# Patient Record
Sex: Female | Born: 1980
Health system: Southern US, Community
[De-identification: ages and names within clinical notes are randomized; demographics above are authoritative.]

## PROBLEM LIST (undated history)

## (undated) DIAGNOSIS — R011 Cardiac murmur, unspecified: Secondary | ICD-10-CM

## (undated) DIAGNOSIS — I499 Cardiac arrhythmia, unspecified: Secondary | ICD-10-CM

## (undated) DIAGNOSIS — F419 Anxiety disorder, unspecified: Secondary | ICD-10-CM

## (undated) DIAGNOSIS — Z8619 Personal history of other infectious and parasitic diseases: Secondary | ICD-10-CM

## (undated) DIAGNOSIS — D649 Anemia, unspecified: Secondary | ICD-10-CM

## (undated) HISTORY — DX: Anemia, unspecified: D64.9

## (undated) HISTORY — DX: Personal history of other infectious and parasitic diseases: Z86.19

## (undated) HISTORY — DX: Cardiac murmur, unspecified: R01.1

## (undated) HISTORY — DX: Anxiety disorder, unspecified: F41.9

## (undated) HISTORY — PX: NO PAST SURGERIES: SHX2092

---

## 2008-09-09 DIAGNOSIS — F419 Anxiety disorder, unspecified: Secondary | ICD-10-CM

## 2008-09-09 HISTORY — DX: Anxiety disorder, unspecified: F41.9

## 2010-10-03 ENCOUNTER — Ambulatory Visit: Admit: 2010-10-03 | Payer: Self-pay | Admitting: Obstetrics and Gynecology

## 2011-03-27 ENCOUNTER — Encounter: Payer: Self-pay | Admitting: Obstetrics & Gynecology

## 2011-03-29 ENCOUNTER — Other Ambulatory Visit: Payer: Self-pay | Admitting: Advanced Practice Midwife

## 2011-03-29 ENCOUNTER — Other Ambulatory Visit (HOSPITAL_COMMUNITY)
Admission: RE | Admit: 2011-03-29 | Discharge: 2011-03-29 | Disposition: A | Discharge status: Home / Self Care | Payer: BC Managed Care – PPO | Source: Ambulatory Visit | Attending: Advanced Practice Midwife | Admitting: Advanced Practice Midwife

## 2011-03-29 ENCOUNTER — Ambulatory Visit (INDEPENDENT_AMBULATORY_CARE_PROVIDER_SITE_OTHER): Payer: BC Managed Care – PPO | Admitting: Physician Assistant

## 2011-03-29 ENCOUNTER — Encounter: Payer: Self-pay | Admitting: Physician Assistant

## 2011-03-29 VITALS — BP 123/81 | HR 92 | Temp 97.8°F | Ht 62.0 in | Wt 111.0 lb

## 2011-03-29 DIAGNOSIS — O039 Complete or unspecified spontaneous abortion without complication: Secondary | ICD-10-CM

## 2011-03-29 DIAGNOSIS — Z01419 Encounter for gynecological examination (general) (routine) without abnormal findings: Secondary | ICD-10-CM | POA: Insufficient documentation

## 2011-03-29 DIAGNOSIS — N941 Unspecified dyspareunia: Secondary | ICD-10-CM

## 2011-03-29 DIAGNOSIS — IMO0002 Reserved for concepts with insufficient information to code with codable children: Secondary | ICD-10-CM

## 2011-03-29 DIAGNOSIS — Z538 Procedure and treatment not carried out for other reasons: Secondary | ICD-10-CM

## 2011-03-29 LAB — POCT PREGNANCY, URINE: Preg Test, Ur: NEGATIVE

## 2011-03-29 LAB — POCT URINE PREGNANCY: Preg Test, Ur: NEGATIVE

## 2011-03-29 NOTE — Progress Notes (Signed)
Negative pregnancy test

## 2011-03-29 NOTE — Progress Notes (Deleted)
Subjective:     Patient ID: Michelle Hayes, female   DOB: Jun 15, 1981, 30 y.o.   MRN: [id number]  HPI   Review of Systems     Objective:   Physical Exam     Assessment:     ***    Plan:     ***

## 2011-03-29 NOTE — Progress Notes (Signed)
  Subjective:    Patient ID: Michelle Hayes, female    DOB: [birth year], 30 y.o.   MRN: [id number]  HPI Presents with c/o dyspareunia at times since the birth of her child several years ago. She states that it does not hurt every time nor in every position, but some positions hurt some times. She had none of this prior to pregnancy. She reports having a large hematoma after her childbirth and did not have intercourse "for a long time" but then started having intermittent sharp pains with some positions.   She also reports possibly having a miscarriage last week. She took a pregnancy test 1.5 weeks ago which was positive, but then had an episode of heavy bleeding for several days last week. The pregnancy test after that was negative.   She also requests a pap test today but declines STD testing.    Review of Systems  Constitutional: Negative for appetite change.  Genitourinary: Positive for vaginal bleeding (last week) and pelvic pain (with intercourse intermittently in certain positions). Negative for dysuria.       Objective:   Physical Exam  Constitutional: She is oriented to person, place, and time. She appears well-developed and well-nourished.  HENT:  Head: Normocephalic.  Neck: Normal range of motion.  Cardiovascular: Normal rate.   Pulmonary/Chest: Effort normal.  Abdominal: Soft. She exhibits no distension and no mass. There is no tenderness. There is no rebound and no guarding.  Genitourinary: Vagina normal and uterus normal. Guaiac stool: Cervix large, TZ zone seen, Pap done.       Uterus small, nontender, about 4-5 wks size. Ovaries nontender, no masses appreciated. No blood seen at all  Musculoskeletal: Normal range of motion.  Neurological: She is alert and oriented to person, place, and time.  Skin: Skin is warm and dry.  Psychiatric: She has a normal mood and affect.          Assessment & Plan:  A:  Normal well woman exam Dyspareunia, probably positional, no  abnormalities noted to account for pain Probably recent SAB, desires to conceive again  P:  Pap sent. Declines cultures Ultrasound ordered to r/o pathology Quant HCG and Type/Rh Will follow Quants until <2 Discussed conception after miscarriage. Will continue PNV.   Return for results of U/S and labs

## 2011-03-29 NOTE — Patient Instructions (Signed)
Miscarriage An early pregnancy loss or spontaneous abortion (miscarriage) is a common problem. This usually happens when the pregnancy is not developing normally. It is very unlikely that you or your partner did anything to cause this, although cigarette smoking, a sexually transmitted disease, excessive alcohol use, or drug abuse can increase the risk. Other causes are:  Abnormalities of the uterus.   Hormone or medical problems.   Trauma or genetic (chromosome) problems.  Having a miscarriage does not change your chances of having a normal pregnancy in the future. Your caregiver will advise when to try to get pregnant again. AFTER A MISCARRIAGE  A miscarriage is inevitable when there is continual, heavy vaginal bleeding; cramping; dilation of the outlet of the womb (cervix); or passing of any pregnancy tissue. Bleeding and cramping will usually continue until all the tissue has been removed from the womb (uterus).   Often the uterus does not clean itself out completely and a medication or a D&C procedure is needed to loosen or remove the pregnancy tissue from the uterus. A D&C scrapes or suctions the tissue out.   If you are RH negative, you may need to have Rh immune globulin to avoid Rh problems.   You may be given medication to fight an infection if the miscarriage was due to an infection.  HOME CARE INSTRUCTIONS  You should rest in bed for the next 2 to 3 days.   Do not take tub baths or put anything in your birth canal (vagina), including tampons or douche.   Avoid exercise or heavy activities until directed by your caregiver.   Do not have sex until your caregiver approves.   Save any vaginal discharge that looks like tissue. Ask your caregiver if he or she wants to inspect the discharge.   If you and your partner are having problems with guilt or grieving, talk to your caregiver or get counseling to help you understand and cope with your pregnancy loss.   Allow enough time to  grieve before trying to get pregnant again.  SEEK IMMEDIATE MEDICAL CARE IF:  You have persistent heavy bleeding or a bad smelling vaginal discharge.   You have continued belly (abdominal) or pelvic pain.   You develop a fever.   You have severe weakness, fainting, or repeated vomiting.   You develop chills.   You are experiencing domestic violence.  MAKE SURE YOU:  Understand these instructions.   Will watch your condition.   Will get help right away if you are not doing well or get worse.  Document Released: 10/03/2004 Document Re-Released: 02/13/2010 [facility] Patient Information 2011 [place], [place].Dyspareunia, Painful Intercourse Dyspareunia is pain caused during sexual intercourse. It is most common in women, but it also happens in men. It is important to tell your caregiver when your pain started, the location of the pain, the type of pain, and when during intercourse it occurs. This helps diagnose and treat the problem. FEMALE CAUSES  The pain from this condition is usually felt when anything is put into the vagina. Even sitting or wearing pants can cause pain. Any part of the genitals can cause pain during sex. Some causes of pain during intercourse are:  Infections of the skin around the vagina.   Vaginal infections, yeast, bacteria, or virus infection.   Vaginismus is a spasm of the muscles around the vagina. The spasm narrows and tightens the vagina and causes pain. The pain of the spasms can be so severe that intercourse is impossible.  Allergic reaction from sperimicides, condoms, scented tampons, soaps, douches, and vaginal sprays.   Cyst (closed sac) on the Bartholin or skene glands, located at the opening of the vagina.   Scar tissue in the vagina, from an episiotomy (surgically enlarged opening), or tearing after delivering a baby.   Vaginal dryness. This is more common in menopause. The normal secretions of the vagina are decreased. Changes in estrogen  levels and increased difficulty becoming aroused can cause painful sex.   Lack of foreplay, to lubricate the vagina, can cause vaginal dryness.   Noncancerous tumors (fibroids) in the uterus.   Endometriosis (uterus lining tissue growing outside the uterus).   Tubal pregnancy (pregnancy that starts in the fallopian tube).   Pregnancy or breastfeeding your baby can cause vaginal dryness.   A tilting or prolapse of the uterus. Prolapse is when weak and stretched muscles around the uterus allow it to fall into the vagina.   Problems with the ovaries, cyst, or scar tissue. This may be worse with certain sexual positions.   Previous surgeries causing adhesions or scar tissue, in the vagina or pelvis.   Bladder and intestinal problems.   Psychological problems may cause pain.   Negative attitudes about sex, experiencing rape, sexual assault, and misinformation about sex are often related to some types of pain.   Previous pelvic infection, causing scar tissue in the pelvis and on the female organs.   Cyst or tumor on the ovary.   Cancer of the female organs.   Certain medicines may be responsible.   Medical problems, diabetes, arthritis, thyroid disease.   Sometimes a cause cannot be found.  FEMALE CAUSES In men, there are many physical causes of sexual discomfort. Some causes of pain during intercourse are:  Infections of the prostate, bladder, or seminal vesicles can cause pain after ejaculation.   Men suffering from interstitial cystitis (inflamed bladder) may have pain from ejaculation.   Gonorrheal infections may cause pain during ejaculation.   Urethritis (inflamed urethra) or prostatitis (inflamed prostate) can make genital stimulation painful or uncomfortable.   Deformities of the penis, such as Peyronie's disease, may cause pain during intercourse.   A tight foreskin may be a cause of pain.   Cancer of the female organs.   Psychological problems are also a cause of  pain.  DIAGNOSIS  Your caregiver will take a history and have you describe where the pain is located (outside the vagina, in the vagina, in the pelvis).   Following this, your caregiver will do a physical exam.   For females, let your caregiver know if the exam is too painful.   During the final part of the exam, your doctor will feel your uterus and ovaries with one hand on the abdomen and one finger in your vagina. This is a pelvic exam.   Blood tests, Pap test, cultures for infection, ultrasound test, and X-rays may be done. You may need to see a specialist for female problems (gynecologist).   Your caregiver may do a laparoscopy (looking into the pelvis with a lighted tube, through an incision in the abdomen), CT Scan, or MRI.  TREATMENT Your caregiver can help you determine the best course of treatment. Sometimes more testing is done. Continue with the suggested testing, until your caregiver feels sure about your diagnosis and how to treat it. Continue with a caregiver until you are satisfied that the reason for the pain has been found. Be sure that you know what to do about it.  Sometimes it is difficult to find the reason for the pain. The search for the cause can be frustrating.  The treatment depends on the cause of the pain. Treatment may include:  Medicines such as antibiotics, vaginal or skin creams, hormones, or antidepressants.   You may need minor or major surgery.   Psychological counseling or group therapy may be needed.   Kegel exercises and vaginal dilators, to help certain cases of vaginismus (spasms).   Apply lubrication as recommended by your caregiver, if you have dryness.   Seek counseling, if treatment does not help.   Sex therapy for you and your sex partner may be necessary.  It is common for the pain to continue after the reason for the pain has been treated. Some reasons for this include a conditioned response. This means that the person having the pain  becomes so familiar with the pain, that the pain continues as a response, even though the cause is removed. HOME CARE INSTRUCTIONS  Follow your caregiver's instructions about taking medicines, doing tests, counseling, and follow-up treatment.   Do not use scented tampons, douches, vaginal sprays, or soaps.   Use water- based lubricants for dryness. Oil lubricants can cause irritation.   Do not use spermicides or condoms that irritate you.   Openly discuss with your partner your sexual experience, your desires, foreplay, and different sexual positions for a more comfortable and enjoyable sexual relationship.   Join group sessions for therapy, if needed.   Practice safe sex at all times.   Discuss the problem with your sex partner.   Empty your bladder before having intercourse.   Try different positions during sexual intercourse.   Take over-the-counter pain medicine recommended by your caregiver, before having sexual intercourse.  SEEK MEDICAL CARE IF:  You develop vaginal bleeding after sexual intercourse.   You develop a lump at the opening of your vagina, even if it is not painful.   You have abnormal vaginal discharge.   You have vaginal dryness.   You have itching or irritation of the vulva and/or vagina.   You develop a rash or reaction to your medicine.  SEEK IMMEDIATE MEDICAL CARE IF:  You develop severe abdominal pain during or shortly after sexual intercourse. (You could have a ruptured ovarian cyst or ruptured tubal pregnancy.)   You develop a temperature of 100F (37.9C) or higher, after having sexual intercourse.   You have painful or bloody urination.   You have painful sexual intercourse, and you never had it before.   You pass out after having sexual intercourse.  Document Released: 09/15/2007 Document Re-Released: 11/20/2009 [facility] Patient Information 2011 [place], [place].

## 2011-03-30 LAB — ABO

## 2011-04-03 ENCOUNTER — Ambulatory Visit (HOSPITAL_COMMUNITY)
Admission: RE | Admit: 2011-04-03 | Discharge: 2011-04-03 | Disposition: A | Discharge status: Home / Self Care | Payer: BC Managed Care – PPO | Source: Ambulatory Visit | Attending: Advanced Practice Midwife | Admitting: Advanced Practice Midwife

## 2011-04-03 DIAGNOSIS — IMO0002 Reserved for concepts with insufficient information to code with codable children: Secondary | ICD-10-CM | POA: Insufficient documentation

## 2011-04-03 DIAGNOSIS — Z538 Procedure and treatment not carried out for other reasons: Secondary | ICD-10-CM

## 2011-04-03 DIAGNOSIS — N949 Unspecified condition associated with female genital organs and menstrual cycle: Secondary | ICD-10-CM | POA: Insufficient documentation

## 2011-04-03 DIAGNOSIS — Z01419 Encounter for gynecological examination (general) (routine) without abnormal findings: Secondary | ICD-10-CM

## 2011-04-03 DIAGNOSIS — O039 Complete or unspecified spontaneous abortion without complication: Secondary | ICD-10-CM

## 2011-04-03 DIAGNOSIS — N941 Unspecified dyspareunia: Secondary | ICD-10-CM

## 2011-04-08 ENCOUNTER — Telehealth: Payer: Self-pay | Admitting: *Deleted

## 2011-04-08 NOTE — Telephone Encounter (Signed)
See Comment.  Not sure how to respond back! :)     Will see if this message makes its way to you!  She thought she had a miscarriage the week before. Her Quant was Negative and her U/S was normal. Pap was normal.   So I am ok with her not coming back for results, unless she wants to discuss further issues.   Can we just call her with results?

## 2011-04-08 NOTE — Telephone Encounter (Signed)
Returned pt call. Left message that I will route her request to Wynelle Bourgeois CNM.

## 2011-04-11 NOTE — Telephone Encounter (Signed)
Called pt- left message that all test results were normal and no need to return for appt. If she still has questions she may call back and leave message.

## 2011-08-17 ENCOUNTER — Inpatient Hospital Stay (HOSPITAL_COMMUNITY)
Admission: AD | Admit: 2011-08-17 | Discharge: 2011-08-17 | Disposition: A | Discharge status: Home / Self Care | Payer: BC Managed Care – PPO | Source: Ambulatory Visit | Attending: Obstetrics and Gynecology | Admitting: Obstetrics and Gynecology

## 2011-08-17 ENCOUNTER — Encounter (HOSPITAL_COMMUNITY): Payer: Self-pay

## 2011-08-17 ENCOUNTER — Inpatient Hospital Stay (HOSPITAL_COMMUNITY): Payer: BC Managed Care – PPO

## 2011-08-17 DIAGNOSIS — O209 Hemorrhage in early pregnancy, unspecified: Secondary | ICD-10-CM

## 2011-08-17 HISTORY — DX: Cardiac arrhythmia, unspecified: I49.9

## 2011-08-17 LAB — ABO/RH: ABO/RH(D): A POS

## 2011-08-17 LAB — URINALYSIS, ROUTINE W REFLEX MICROSCOPIC
Glucose, UA: NEGATIVE mg/dL
Hgb urine dipstick: NEGATIVE
Leukocytes, UA: NEGATIVE
pH: 6 (ref 5.0–8.0)

## 2011-08-17 LAB — POCT PREGNANCY, URINE: Preg Test, Ur: POSITIVE

## 2011-08-17 LAB — CBC
Hemoglobin: 13.2 g/dL (ref 12.0–15.0)
MCH: 31.5 pg (ref 26.0–34.0)
Platelets: 231 10*3/uL (ref 150–400)
RBC: 4.19 MIL/uL (ref 3.87–5.11)
WBC: 8.8 10*3/uL (ref 4.0–10.5)

## 2011-08-17 LAB — HCG, QUANTITATIVE, PREGNANCY: hCG, Beta Chain, Quant, S: 42147 m[IU]/mL — ABNORMAL HIGH (ref <5)

## 2011-08-17 LAB — WET PREP, GENITAL

## 2011-08-17 NOTE — Progress Notes (Signed)
Pt states, " I started having dark brown spotting four days ago, and then it turned pink, and today it is bright red and heavier and I need to wear a pad. I have pressure in my low abdomen."

## 2011-08-17 NOTE — Progress Notes (Signed)
Patient is here with c/o vaginal bleeding. She states that 4 days ago she had brown spotting, today she passed a medium size red blood clot. She states that she has constant pelvic and lower abdomen pressure. She states that she had a miscarriage in June 2012. Dime size blood spot on pad that patient came in with which she put on 2 hours ago.

## 2011-08-17 NOTE — ED Provider Notes (Signed)
History     Chief Complaint  Patient presents with  . Vaginal Bleeding   HPI Michelle Hayes 30 y.o. 6w 4d by LMP of 07-03-11.  Having vaginal bleeding tonight and very worried as she had a miscarriage in June 2012.  Has not yet started prenatal care.  OB History    Grav Para Term Preterm Abortions TAB SAB Ect Mult Living   4 1   2 1 1   1       Past Medical History  Diagnosis Date  . Heart murmur   . Anemia   . Anxiety 2010    panic attacks  . Anemia   . Irregular heart beat     History reviewed. No pertinent past surgical history.  Family History  Problem Relation Age of Onset  . Cancer Maternal Grandfather     colon cancer    History  Substance Use Topics  . Smoking status: Never Smoker   . Smokeless tobacco: Never Used  . Alcohol Use: 0.6 oz/week    1 Glasses of wine per week    Allergies: No Known Allergies  Prescriptions prior to admission  Medication Sig Dispense Refill  . Prenat w/o A-FE-DSS-Methfol-FA (PRENATAL MULTIVITAMIN) 90-600-400 MG-MCG-MCG tablet Take 1 tablet by mouth daily.          ROS Physical Exam   Blood pressure 115/53, pulse 96, temperature 99.1 F (37.3 C), temperature source Oral, resp. rate 16, height 5\' 2"  (1.575 m), weight 111 lb 4 oz (50.463 kg), last menstrual period 07/02/2011.  Physical Exam  Nursing note and vitals reviewed. Constitutional: She is oriented to person, place, and time. She appears well-developed and well-nourished.  HENT:  Head: Normocephalic.  Eyes: EOM are normal.  Neck: Neck supple.  GI: Soft. There is no tenderness. There is no rebound and no guarding.  Genitourinary:       Speculum exam: Vagina - Small amount of creamy discharge, no odor Cervix - minimal pink bleeding seen on swab Bimanual exam: Cervix closed Uterus non tender, 6 week size Adnexa non tender, no masses bilaterally GC/Chlam, wet prep done Chaperone present for exam.  Musculoskeletal: Normal range of motion.  Neurological: She is  alert and oriented to person, place, and time.  Skin: Skin is warm and dry.  Psychiatric: She has a normal mood and affect.    MAU Course  Procedures Results for orders placed during the hospital encounter of 08/17/11 (from the past 24 hour(s))  URINALYSIS, ROUTINE W REFLEX MICROSCOPIC     Status: Normal   Collection Time   08/17/11  6:00 PM      Component Value Range   Color, Urine YELLOW  YELLOW    APPearance CLEAR  CLEAR    Specific Gravity, Urine 1.010  1.005 - 1.030    pH 6.0  5.0 - 8.0    Glucose, UA NEGATIVE  NEGATIVE (mg/dL)   Hgb urine dipstick NEGATIVE  NEGATIVE    Bilirubin Urine NEGATIVE  NEGATIVE    Ketones, ur NEGATIVE  NEGATIVE (mg/dL)   Protein, ur NEGATIVE  NEGATIVE (mg/dL)   Urobilinogen, UA 0.2  0.0 - 1.0 (mg/dL)   Nitrite NEGATIVE  NEGATIVE    Leukocytes, UA NEGATIVE  NEGATIVE   POCT PREGNANCY, URINE     Status: Normal   Collection Time   08/17/11  8:05 PM      Component Value Range   Preg Test, Ur POSITIVE    ABO/RH     Status: Normal   Collection  Time   08/17/11  9:10 PM      Component Value Range   ABO/RH(D) A POS    CBC     Status: Normal   Collection Time   08/17/11  9:10 PM      Component Value Range   WBC 8.8  4.0 - 10.5 (K/uL)   RBC 4.19  3.87 - 5.11 (MIL/uL)   Hemoglobin 13.2  12.0 - 15.0 (g/dL)   HCT 16.1  09.6 - 04.5 (%)   MCV 90.7  78.0 - 100.0 (fL)   MCH 31.5  26.0 - 34.0 (pg)   MCHC 34.7  30.0 - 36.0 (g/dL)   RDW 40.9  81.1 - 91.4 (%)   Platelets 231  150 - 400 (K/uL)  HCG, QUANTITATIVE, PREGNANCY     Status: Abnormal   Collection Time   08/17/11  9:10 PM      Component Value Range   hCG, Beta Chain, Quant, S 78295 (*) <5 (mIU/mL)  WET PREP, GENITAL     Status: Abnormal   Collection Time   08/17/11  9:40 PM      Component Value Range   Yeast, Wet Prep FEW (*) NONE SEEN    Trich, Wet Prep NONE SEEN  NONE SEEN    Clue Cells, Wet Prep MODERATE (*) NONE SEEN    WBC, Wet Prep HPF POC MANY (*) NONE SEEN     MDM Clinical Data:  30 year old G4 P1, LMP 07/02/2011 (6 weeks 4 days),  presenting with vaginal bleeding.  OBSTETRIC <14 WK [place] AND TRANSVAGINAL OB US 08/17/2011:  Technique: Both transabdominal and transvaginal ultrasound  examinations were performed for complete evaluation of the  gestation as well as the maternal uterus, adnexal regions, and  pelvic cul-de-sac. Transvaginal technique was performed to assess  early pregnancy.  Comparison: None.  Intrauterine gestational sac: Single, normal in shape.  Yolk sac: Visualized.  Embryo: Visualized.  Cardiac Activity: Visualized.  Heart Rate: 120 bpm  CRL: 3.9 mm 6 w 1 d [place] EDC: 04/10/2012.  Maternal uterus/adnexae:  Enlarged right ovary measuring approximately 7.6 x 5.43 x 7.1 cm,  containing an approximate 7.3 x 5.3 x 6.0 cm simple cyst. Normal  power Doppler signal within the right ovary. Left ovary normal in  size and appearance, containing small follicular cyst, measuring  approximately 2.5 x 1.1 x 1.8 cm. No other adnexal masses. No  free pelvic fluid.  IMPRESSION:  1. Single live intrauterine embryo with estimated gestational age  [redacted] weeks 1 day by crown-rump length, correlating well with the  estimated gestational age by LMP. Ultrasound [facility] 04/10/2012.  2. Approximate 7 cm simple cyst arising from the right ovary.   Assessment and Plan  Bleeding in early pregnancy  Plan Normal ultrasound today at 6w 1d Begin prenatal care as soon as possible.  BURLESON,TERRI 08/17/2011, 10:39 PM   Nolene Bernheim, NP 08/17/11 2311

## 2011-08-18 NOTE — ED Provider Notes (Signed)
Agree with above note.  Sonni Barse 08/18/2011 7:20 AM   

## 2011-08-19 LAB — GC/CHLAMYDIA PROBE AMP, GENITAL
Chlamydia, DNA Probe: NEGATIVE
GC Probe Amp, Genital: NEGATIVE

## 2011-09-06 LAB — OB RESULTS CONSOLE ABO/RH: RH Type: POSITIVE

## 2011-09-06 LAB — OB RESULTS CONSOLE RPR: RPR: NONREACTIVE

## 2011-09-10 NOTE — L&D Delivery Note (Signed)
Delivery Note   Pt had BBOW at [place] at 1033, AROM for clear fluid and pt pushed well.   At 10:53 AM a viable female was delivered via Vaginal, Spontaneous Delivery (Presentation: ; Occiput Anterior).  Compound presentation of R arm, shoulders delivered easily, APGAR: 9, 9; weight . pending  Placenta status: Intact, Spontaneous.  Cord: 3 vessels with the following complications: None.  Cord pH: n/a Routine cord blood collected   Anesthesia: Epidural  Episiotomy: None Lacerations: 1st degree Suture Repair: 4-0 monocryl  Est. Blood Loss (mL): 300  Mom to postpartum.  Baby to nursery-stable. Rooming in Pt plans to BF Dr Stefano Gaul notified   Malissa Hippo 04/05/2012, 11:54 AM

## 2011-09-23 LAB — OB RESULTS CONSOLE GC/CHLAMYDIA: Gonorrhea: NEGATIVE

## 2011-11-11 ENCOUNTER — Encounter (INDEPENDENT_AMBULATORY_CARE_PROVIDER_SITE_OTHER): Payer: BC Managed Care – PPO | Admitting: Registered Nurse

## 2011-11-11 ENCOUNTER — Other Ambulatory Visit (INDEPENDENT_AMBULATORY_CARE_PROVIDER_SITE_OTHER): Payer: BC Managed Care – PPO

## 2011-11-11 DIAGNOSIS — Z331 Pregnant state, incidental: Secondary | ICD-10-CM

## 2011-11-11 DIAGNOSIS — Z1389 Encounter for screening for other disorder: Secondary | ICD-10-CM

## 2011-11-13 ENCOUNTER — Other Ambulatory Visit: Payer: Self-pay | Admitting: Obstetrics and Gynecology

## 2011-11-13 DIAGNOSIS — N63 Unspecified lump in unspecified breast: Secondary | ICD-10-CM

## 2011-11-15 ENCOUNTER — Ambulatory Visit
Admission: RE | Admit: 2011-11-15 | Discharge: 2011-11-15 | Disposition: A | Discharge status: Home / Self Care | Payer: BC Managed Care – PPO | Source: Ambulatory Visit | Attending: Obstetrics and Gynecology | Admitting: Obstetrics and Gynecology

## 2011-11-15 DIAGNOSIS — N63 Unspecified lump in unspecified breast: Secondary | ICD-10-CM

## 2011-11-25 ENCOUNTER — Encounter (INDEPENDENT_AMBULATORY_CARE_PROVIDER_SITE_OTHER): Payer: BC Managed Care – PPO | Admitting: Obstetrics and Gynecology

## 2011-11-25 DIAGNOSIS — Z331 Pregnant state, incidental: Secondary | ICD-10-CM

## 2011-12-09 ENCOUNTER — Encounter (INDEPENDENT_AMBULATORY_CARE_PROVIDER_SITE_OTHER): Payer: BC Managed Care – PPO | Admitting: Registered Nurse

## 2011-12-09 DIAGNOSIS — Z331 Pregnant state, incidental: Secondary | ICD-10-CM

## 2011-12-12 DIAGNOSIS — F410 Panic disorder [episodic paroxysmal anxiety] without agoraphobia: Secondary | ICD-10-CM | POA: Insufficient documentation

## 2011-12-12 DIAGNOSIS — O209 Hemorrhage in early pregnancy, unspecified: Secondary | ICD-10-CM | POA: Insufficient documentation

## 2012-01-06 ENCOUNTER — Ambulatory Visit (INDEPENDENT_AMBULATORY_CARE_PROVIDER_SITE_OTHER): Payer: BC Managed Care – PPO | Admitting: Obstetrics and Gynecology

## 2012-01-06 ENCOUNTER — Encounter: Payer: Self-pay | Admitting: Obstetrics and Gynecology

## 2012-01-06 ENCOUNTER — Other Ambulatory Visit: Payer: BC Managed Care – PPO

## 2012-01-06 VITALS — BP 104/68 | Wt 116.0 lb

## 2012-01-06 DIAGNOSIS — Z331 Pregnant state, incidental: Secondary | ICD-10-CM

## 2012-01-06 NOTE — Progress Notes (Addendum)
Patient ID: Michelle Hayes, female   DOB: [birth year], 31 y.o.   MRN: [id number] Reviewed s/s preterm labor, srom, vag bleeding, kick counts to report, enc 8 water daily and frequent voids 1 gtt, hgb, rpr today [name], CNM

## 2012-01-07 ENCOUNTER — Telehealth: Payer: Self-pay

## 2012-01-07 DIAGNOSIS — O99810 Abnormal glucose complicating pregnancy: Secondary | ICD-10-CM

## 2012-01-07 LAB — GLUCOSE TOLERANCE, 1 HOUR: Glucose, 1 Hour GTT: 148 mg/dL — ABNORMAL HIGH (ref 70–140)

## 2012-01-07 NOTE — Telephone Encounter (Signed)
Spoke with pt informing her 1 hr gtt results abnormal and 3 hr gtt is recommended per MK. Pt won't be able to start diet until May 13th. Informed pt [facility] is closed at the moment & we will contact them tomorrow to make sure May 18th @ 8am is available. Diet instructions reviewed with patient & will be emailed to her as she requested. Pt voices understanding and agrees.

## 2012-01-07 NOTE — Telephone Encounter (Signed)
Message copied by Janeece Agee on Tue Jan 07, 2012  5:03 PM ------      Message from: [facility]      Created: Tue Jan 07, 2012  4:43 PM      Regarding: needs 3 gtt                    ----- Message -----         From: [facility] Interface         Sent: 01/07/2012  12:39 AM           To: [name], CNM

## 2012-01-08 ENCOUNTER — Telehealth: Payer: Self-pay

## 2012-01-08 NOTE — Telephone Encounter (Signed)
Message copied by Janeece Agee on Wed Jan 08, 2012  3:55 PM ------      Message from: [facility]      Created: Tue Jan 07, 2012  4:43 PM      Regarding: needs 3 gtt                    ----- Message -----         From: [facility] In [facility] Interface         Sent: 01/07/2012  12:39 AM           To: [name], CNM

## 2012-01-08 NOTE — Telephone Encounter (Signed)
Spoke with pt regarding 3 gtt. Informed her 3 gtt is scheduled 12/24/11 @8am  with [employer]. Diet instructions reviewed and emailed to pt. Order faxed.

## 2012-01-16 ENCOUNTER — Encounter: Payer: Self-pay | Admitting: Obstetrics and Gynecology

## 2012-01-20 ENCOUNTER — Encounter: Payer: Self-pay | Admitting: Obstetrics and Gynecology

## 2012-01-20 ENCOUNTER — Ambulatory Visit (INDEPENDENT_AMBULATORY_CARE_PROVIDER_SITE_OTHER): Payer: BC Managed Care – PPO | Admitting: Obstetrics and Gynecology

## 2012-01-20 VITALS — BP 90/58 | Wt 118.0 lb

## 2012-01-20 DIAGNOSIS — Z331 Pregnant state, incidental: Secondary | ICD-10-CM

## 2012-01-20 LAB — POCT INR: INR: 136

## 2012-01-20 NOTE — Progress Notes (Signed)
Blood glucose- 136 Pt states she had corn flakes w/ raisins & drank water for breakfast  Pt scheduled for 3 hr GTT this coming Thursday

## 2012-01-20 NOTE — Progress Notes (Signed)
Patient ID: Michelle Hayes, female   DOB: [birth year], 31 y.o.   MRN: [id number]  S<D if sm next visit consider [place].  Reviewed s/s labor, uc, srom, vag bleeding, ha, visual spots or blurring, epigastric pain, edema to report, and kick counts. F/o office as scheduled. [name], CNM

## 2012-01-23 ENCOUNTER — Other Ambulatory Visit: Payer: Self-pay | Admitting: Obstetrics and Gynecology

## 2012-01-24 LAB — GLUCOSE TOLERANCE, 3 HOURS
Glucose Tolerance, 1 hour: 162 mg/dL (ref 70–189)
Glucose Tolerance, Fasting: 78 mg/dL (ref 70–104)
Glucose, GTT - 3 Hour: 163 mg/dL — ABNORMAL HIGH (ref 70–144)

## 2012-01-27 NOTE — Progress Notes (Signed)
Addendum note from 01/20/2012 POCT INR fingerstick supposed to be POCT CBG fingerstick

## 2012-01-27 NOTE — Progress Notes (Signed)
Addended by: Janeece Agee on: 01/27/2012 03:40 PM   Modules accepted: Orders

## 2012-02-07 ENCOUNTER — Ambulatory Visit (INDEPENDENT_AMBULATORY_CARE_PROVIDER_SITE_OTHER): Payer: BC Managed Care – PPO | Admitting: Obstetrics and Gynecology

## 2012-02-07 ENCOUNTER — Encounter: Payer: Self-pay | Admitting: Obstetrics and Gynecology

## 2012-02-07 VITALS — BP 98/56 | Wt 118.0 lb

## 2012-02-07 DIAGNOSIS — Z331 Pregnant state, incidental: Secondary | ICD-10-CM

## 2012-02-07 NOTE — Progress Notes (Signed)
Results for orders placed in visit on 01/23/12  GLUCOSE TOLERANCE, 3 HOURS      Component Value Range   Glucose, Fasting-Gestational 78  70 - 104 (mg/dL)   Glucose, 1 Hour-Gestational 162  70 - 189 (mg/dL)   Glucose, 2 Hour-Gestational 149  70 - 164 (mg/dL)   Glucose, GTT - 3 Hour 163 (*) 70 - 144 (mg/dL)   abnormal one hour  Three hour with one abnormal value.  Told pt to decrease sweets and complex carbs Pt with pelvic pressure and occ ctx.  Reviewed PTL cervix l/cl S<d [place] @nv 

## 2012-02-07 NOTE — Patient Instructions (Signed)

## 2012-02-18 IMAGING — US US TRANSVAGINAL NON-OB
1 series · 13 of 25 positions shown · non-contrast
Comparison: None.

CLINICAL DATA: Pelvic pain and dyspareunia. Recent vaginal
bleeding. LMP 03/04/2011.

TRANSABDOMINAL AND TRANSVAGINAL ULTRASOUND OF PELVIS
TECHNIQUE: Both transabdominal and transvaginal ultrasound
examinations of the pelvis were performed.  Transabdominal
technique was performed for global imaging of the pelvis including
uterus, ovaries, adnexal regions, and pelvic cul-de-sac.
It was necessary to proceed with endovaginal exam following the
transabdominal exam to visualize the retroverted uterus,
endometrium, and ovaries.

[Series 1: us pelvis complete · 13 of 37 slices shown]
[im 1/37]
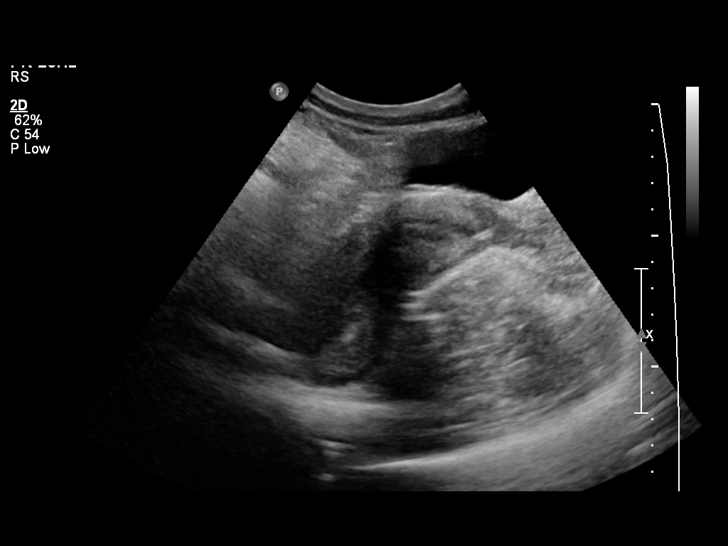
[im 4/37]
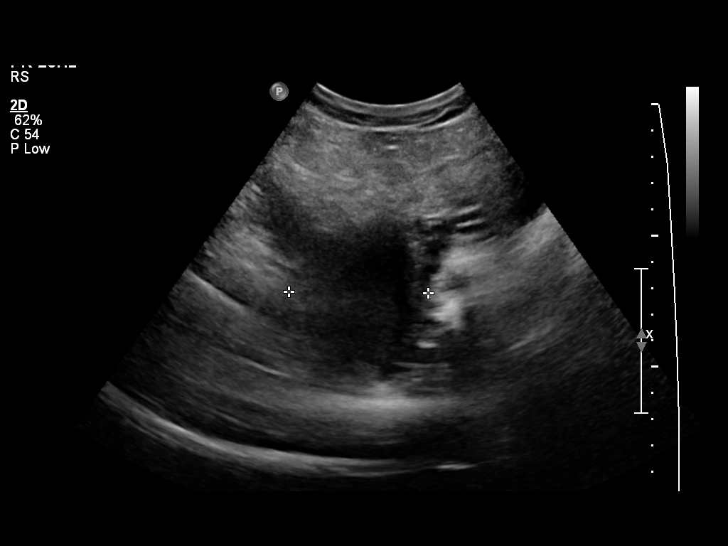
[im 7/37]
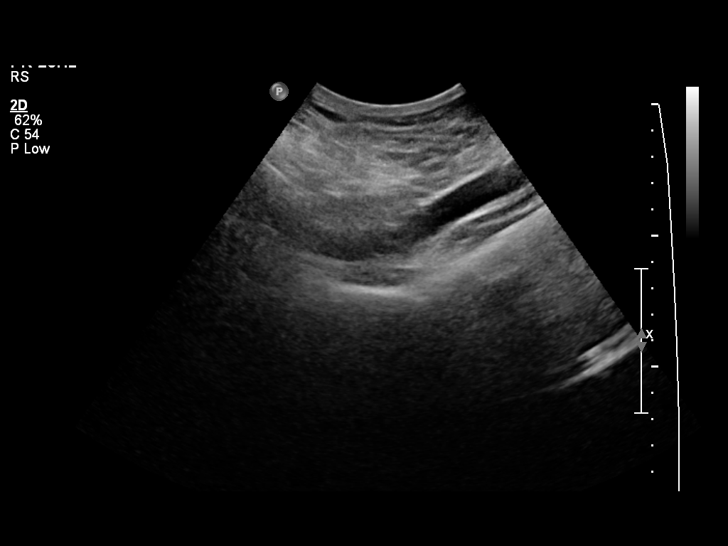
[im 10/37]
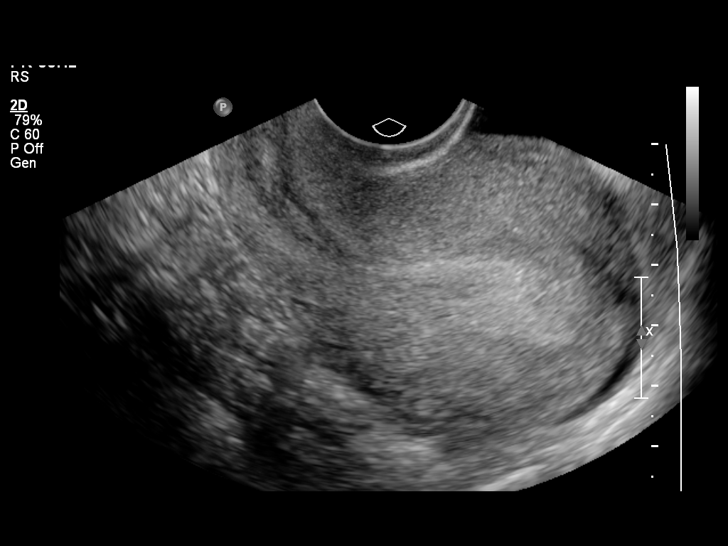
[im 13/37]
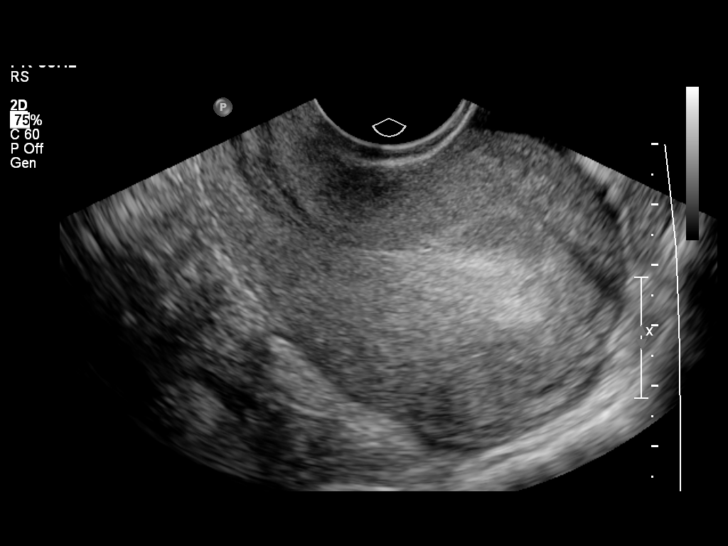
[im 16/37]
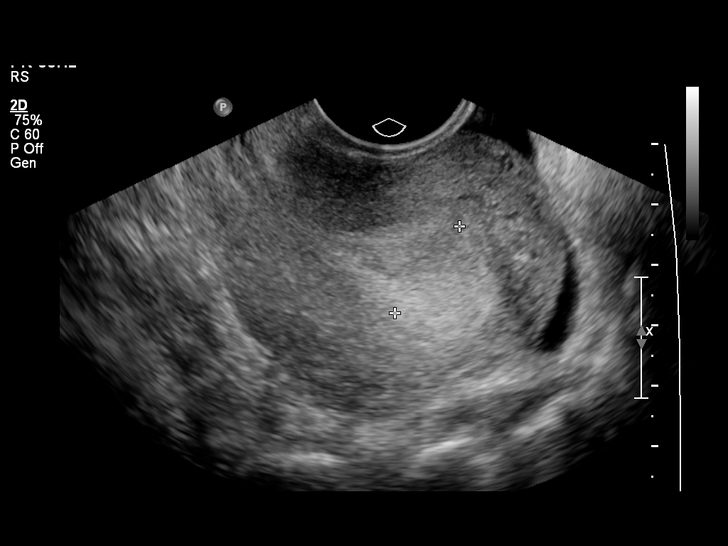
[im 19/37]
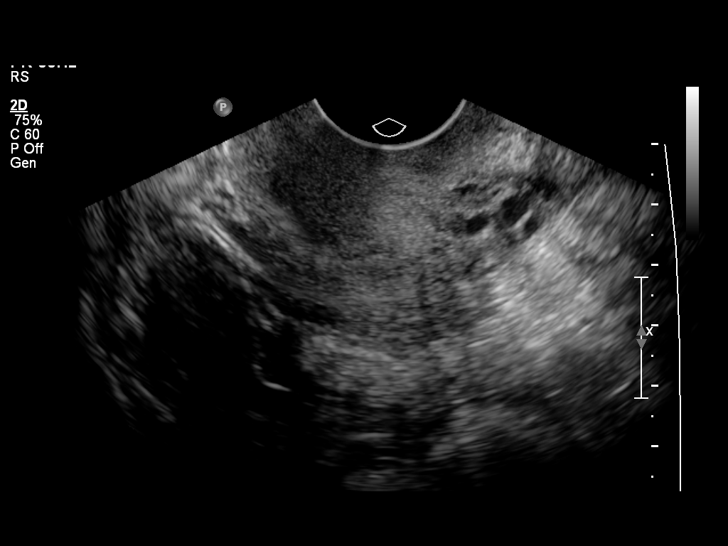
[im 22/37]
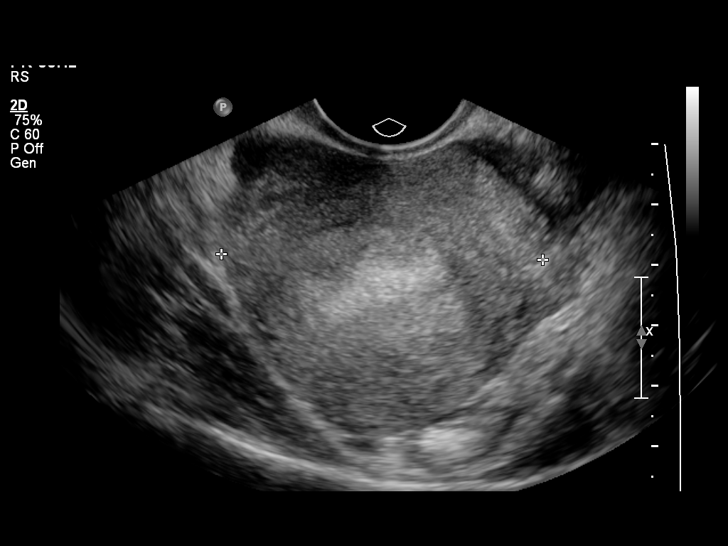
[im 25/37]
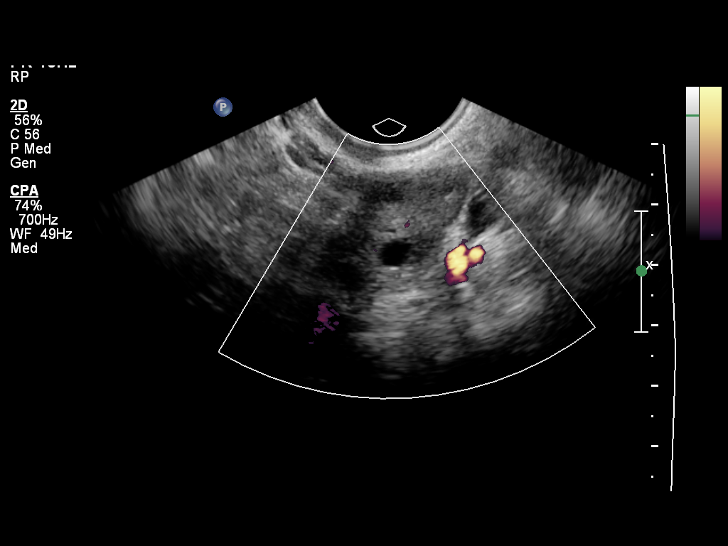
[im 28/37]
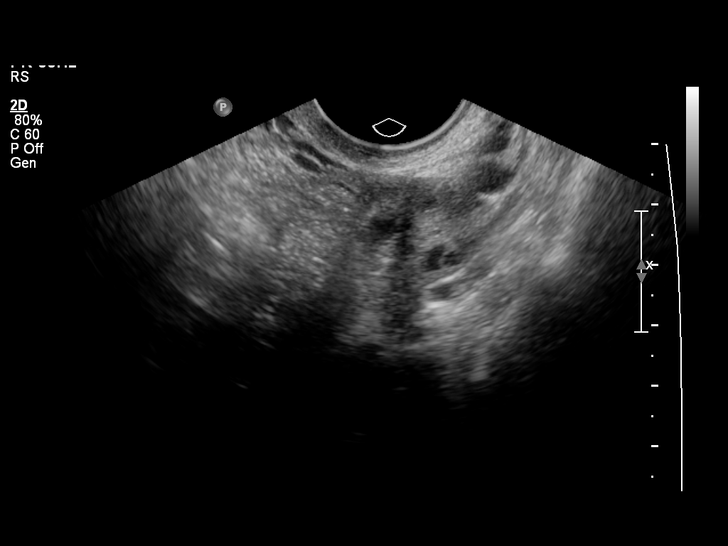
[im 31/37]
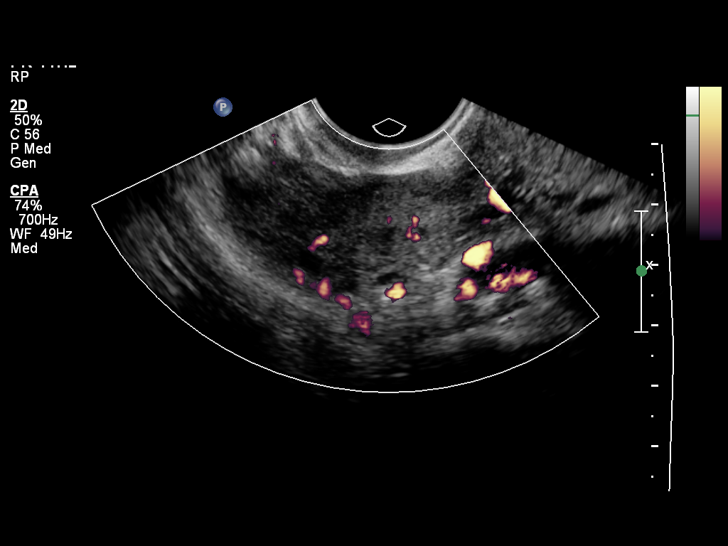
[im 34/37]
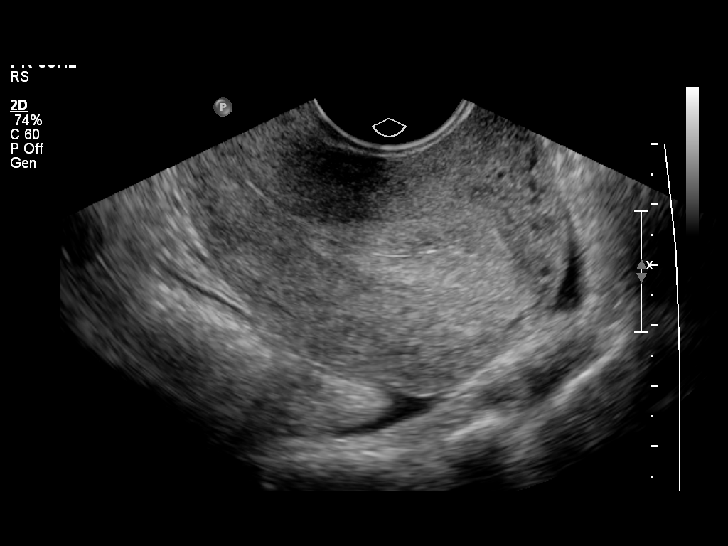
[im 37/37]
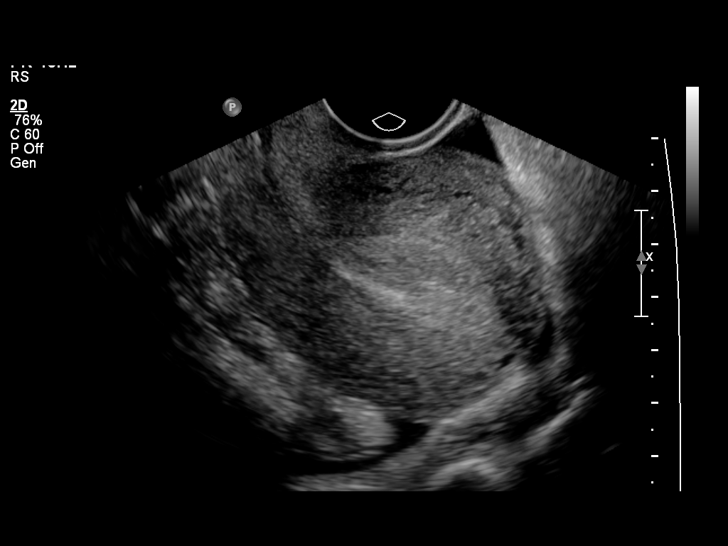

[13 of 25 positions shown; findings below may reference images not displayed]

FINDINGS: Uterus:  7.6 x 5.2 x 5.3 cm.  Retroverted.  No fibroids or other
uterine masses identified.

Endometrium:  18 mm double layer thickness measured transvaginally.
No focal lesion identified.

Right ovary:  4.1 x 1.9 x 1.6 cm.  Normal appearance/no adnexal
mass.

Left ovary:  2.8 x 1.4 x 1.5 cm.  Normal appearance/no adnexal
mass.

Other findings:  Trace free fluid in cul-de-sac.
IMPRESSION: 1.  Retroverted uterus with endometrial thickness measuring 18 mm.
If bleeding is unresponsive to hormonal or medical therapy,
endometrial sampling should be considered in patients at high risk
for endometrial carcinoma, or sonohysterogram could be performed
for focal lesion work-up.
2.  No evidence of fibroids.
3.  Normal appearance of both ovaries.  No evidence of adnexal
mass.

## 2012-02-25 ENCOUNTER — Other Ambulatory Visit: Payer: Self-pay | Admitting: Obstetrics and Gynecology

## 2012-02-25 ENCOUNTER — Ambulatory Visit (INDEPENDENT_AMBULATORY_CARE_PROVIDER_SITE_OTHER): Payer: BC Managed Care – PPO

## 2012-02-25 ENCOUNTER — Encounter: Payer: Self-pay | Admitting: Obstetrics and Gynecology

## 2012-02-25 ENCOUNTER — Ambulatory Visit (INDEPENDENT_AMBULATORY_CARE_PROVIDER_SITE_OTHER): Payer: BC Managed Care – PPO | Admitting: Obstetrics and Gynecology

## 2012-02-25 VITALS — BP 110/68 | Wt 120.0 lb

## 2012-02-25 DIAGNOSIS — Z331 Pregnant state, incidental: Secondary | ICD-10-CM

## 2012-02-25 NOTE — Progress Notes (Signed)
No concerns per pt 

## 2012-02-25 NOTE — Progress Notes (Signed)
Patient ID: Michelle Hayes, female   DOB: 10-20-1980, 31 y.o.   MRN: [id number] Reviewed s/s preterm labor, srom, vag bleeding, kick counts to report, enc 8 water daily and frequent voids [place] results Growth 49% afi low normal 30%  VTX  [name], CNM

## 2012-03-06 ENCOUNTER — Encounter: Payer: Self-pay | Admitting: Obstetrics and Gynecology

## 2012-03-06 ENCOUNTER — Ambulatory Visit (INDEPENDENT_AMBULATORY_CARE_PROVIDER_SITE_OTHER): Payer: BC Managed Care – PPO | Admitting: Obstetrics and Gynecology

## 2012-03-06 VITALS — BP 104/62 | Wt 122.0 lb

## 2012-03-06 DIAGNOSIS — Z331 Pregnant state, incidental: Secondary | ICD-10-CM

## 2012-03-06 NOTE — Progress Notes (Signed)
No complaints

## 2012-03-09 ENCOUNTER — Encounter: Payer: BC Managed Care – PPO | Admitting: Obstetrics and Gynecology

## 2012-03-10 ENCOUNTER — Encounter: Payer: Self-pay | Admitting: Obstetrics and Gynecology

## 2012-03-10 ENCOUNTER — Ambulatory Visit (INDEPENDENT_AMBULATORY_CARE_PROVIDER_SITE_OTHER): Payer: BC Managed Care – PPO | Admitting: Obstetrics and Gynecology

## 2012-03-10 VITALS — BP 100/66 | Wt 122.0 lb

## 2012-03-10 DIAGNOSIS — Z331 Pregnant state, incidental: Secondary | ICD-10-CM

## 2012-03-10 NOTE — Progress Notes (Signed)
No concerns per pt 

## 2012-03-10 NOTE — Progress Notes (Signed)
Doing well, some BH contractions. GBS today. Vtx low in pelvis. Discussed questions regarding last delivery (had 8+lb baby, with "tear of blood vessel" on perineum. Reviewed status--I do not expect a large baby at present, but reassured patient we would watch progress in labor and do all we can to protect the perineum.

## 2012-03-12 LAB — STREP B DNA PROBE: GBSP: NEGATIVE

## 2012-03-17 ENCOUNTER — Ambulatory Visit (INDEPENDENT_AMBULATORY_CARE_PROVIDER_SITE_OTHER): Payer: BC Managed Care – PPO | Admitting: Obstetrics and Gynecology

## 2012-03-17 ENCOUNTER — Encounter: Payer: Self-pay | Admitting: Obstetrics and Gynecology

## 2012-03-17 VITALS — BP 90/56 | Wt 123.5 lb

## 2012-03-17 DIAGNOSIS — Z3490 Encounter for supervision of normal pregnancy, unspecified, unspecified trimester: Secondary | ICD-10-CM

## 2012-03-17 DIAGNOSIS — Z331 Pregnant state, incidental: Secondary | ICD-10-CM

## 2012-03-17 NOTE — Progress Notes (Signed)
No complaints

## 2012-03-24 LAB — US OB FOLLOW UP

## 2012-03-25 ENCOUNTER — Encounter: Payer: Self-pay | Admitting: Obstetrics and Gynecology

## 2012-03-25 ENCOUNTER — Ambulatory Visit (INDEPENDENT_AMBULATORY_CARE_PROVIDER_SITE_OTHER): Payer: BC Managed Care – PPO | Admitting: Obstetrics and Gynecology

## 2012-03-25 VITALS — BP 108/58 | Wt 124.0 lb

## 2012-03-25 DIAGNOSIS — Z331 Pregnant state, incidental: Secondary | ICD-10-CM

## 2012-03-25 NOTE — Progress Notes (Signed)
Doing well. Cystocele and rectocele. Return office in 1 week. Dr. Stefano Gaul

## 2012-03-25 NOTE — Progress Notes (Signed)
C/O: increased pressure It was hard to understand what pt was describing Pt states when she feels a lot of pressure she notices something "extra" is hanging out. Pt states she looked w/ a Ship broker. This was maybe 4 or 5 days ago per pt.  Desires Cervix Check.

## 2012-04-01 ENCOUNTER — Ambulatory Visit (INDEPENDENT_AMBULATORY_CARE_PROVIDER_SITE_OTHER): Payer: BC Managed Care – PPO | Admitting: Obstetrics and Gynecology

## 2012-04-01 VITALS — BP 100/60 | Wt 124.0 lb

## 2012-04-01 DIAGNOSIS — Z331 Pregnant state, incidental: Secondary | ICD-10-CM

## 2012-04-01 NOTE — Progress Notes (Signed)
Return to office in 1 week. Doing well. Dr. Stefano Gaul

## 2012-04-01 NOTE — Progress Notes (Signed)
Pt has no concerns today. Pt desires cervix check today.

## 2012-04-05 ENCOUNTER — Encounter (HOSPITAL_COMMUNITY): Payer: Self-pay | Admitting: *Deleted

## 2012-04-05 ENCOUNTER — Inpatient Hospital Stay (HOSPITAL_COMMUNITY)
Admission: AD | Admit: 2012-04-05 | Discharge: 2012-04-07 | DRG: 373 | Disposition: A | Discharge status: Home / Self Care | Payer: BC Managed Care – PPO | Source: Ambulatory Visit | Attending: Obstetrics and Gynecology | Admitting: Obstetrics and Gynecology

## 2012-04-05 ENCOUNTER — Encounter (HOSPITAL_COMMUNITY): Payer: Self-pay | Admitting: Anesthesiology

## 2012-04-05 ENCOUNTER — Inpatient Hospital Stay (HOSPITAL_COMMUNITY): Payer: BC Managed Care – PPO | Admitting: Anesthesiology

## 2012-04-05 DIAGNOSIS — O328XX0 Maternal care for other malpresentation of fetus, not applicable or unspecified: Secondary | ICD-10-CM | POA: Diagnosis present

## 2012-04-05 DIAGNOSIS — R7309 Other abnormal glucose: Secondary | ICD-10-CM | POA: Diagnosis present

## 2012-04-05 DIAGNOSIS — O280 Abnormal hematological finding on antenatal screening of mother: Secondary | ICD-10-CM

## 2012-04-05 LAB — CBC
Hemoglobin: 12.8 g/dL (ref 12.0–15.0)
MCH: 31.7 pg (ref 26.0–34.0)
MCHC: 32.8 g/dL (ref 30.0–36.0)
MCV: 96.5 fL (ref 78.0–100.0)
RBC: 4.04 MIL/uL (ref 3.87–5.11)

## 2012-04-05 MED ORDER — PHENYLEPHRINE 40 MCG/ML (10ML) SYRINGE FOR IV PUSH (FOR BLOOD PRESSURE SUPPORT)
80.0000 ug | PREFILLED_SYRINGE | INTRAVENOUS | Status: DC | PRN
  Filled 2012-04-05: qty 5

## 2012-04-05 MED ORDER — BENZOCAINE-MENTHOL 20-0.5 % EX AERO
1.0000 "application " | INHALATION_SPRAY | CUTANEOUS | Status: DC | PRN
  Administered 2012-04-06: 1 via TOPICAL
  Filled 2012-04-05: qty 56

## 2012-04-05 MED ORDER — FLEET ENEMA 7-19 GM/118ML RE ENEM: 1.0000 | ENEMA | RECTAL | Status: DC | PRN

## 2012-04-05 MED ORDER — LIDOCAINE HCL (PF) 1 % IJ SOLN: 30.0000 mL | INTRAMUSCULAR | Status: DC | PRN

## 2012-04-05 MED ORDER — OXYCODONE-ACETAMINOPHEN 5-325 MG PO TABS: 1.0000 | ORAL_TABLET | ORAL | Status: DC | PRN

## 2012-04-05 MED ORDER — OXYTOCIN BOLUS FROM INFUSION: 250.0000 mL | Freq: Once | INTRAVENOUS | Status: DC

## 2012-04-05 MED ORDER — MEASLES, MUMPS & RUBELLA VAC ~~LOC~~ INJ: 0.5000 mL | INJECTION | Freq: Once | SUBCUTANEOUS | Status: DC

## 2012-04-05 MED ORDER — LACTATED RINGERS IV SOLN: 500.0000 mL | Freq: Once | INTRAVENOUS | Status: DC

## 2012-04-05 MED ORDER — ONDANSETRON HCL 4 MG/2ML IJ SOLN: 4.0000 mg | Freq: Four times a day (QID) | INTRAMUSCULAR | Status: DC | PRN

## 2012-04-05 MED ORDER — FENTANYL 2.5 MCG/ML BUPIVACAINE 1/10 % EPIDURAL INFUSION (WH - ANES)
14.0000 mL/h | INTRAMUSCULAR | Status: DC
  Filled 2012-04-05 (×2): qty 60

## 2012-04-05 MED ORDER — LACTATED RINGERS IV SOLN
INTRAVENOUS | Status: DC
  Administered 2012-04-05: 10:00:00 via INTRAVENOUS

## 2012-04-05 MED ORDER — IBUPROFEN 600 MG PO TABS
600.0000 mg | ORAL_TABLET | Freq: Four times a day (QID) | ORAL | Status: DC
  Administered 2012-04-06 – 2012-04-07 (×6): 600 mg via ORAL
  Filled 2012-04-05 (×8): qty 1

## 2012-04-05 MED ORDER — IBUPROFEN 600 MG PO TABS: 600.0000 mg | ORAL_TABLET | Freq: Four times a day (QID) | ORAL | Status: DC | PRN

## 2012-04-05 MED ORDER — ONDANSETRON HCL 4 MG/2ML IJ SOLN: 4.0000 mg | INTRAMUSCULAR | Status: DC | PRN

## 2012-04-05 MED ORDER — CITRIC ACID-SODIUM CITRATE 334-500 MG/5ML PO SOLN: 30.0000 mL | ORAL | Status: DC | PRN

## 2012-04-05 MED ORDER — SENNOSIDES-DOCUSATE SODIUM 8.6-50 MG PO TABS
2.0000 | ORAL_TABLET | Freq: Every day | ORAL | Status: DC
  Administered 2012-04-05 – 2012-04-06 (×2): 2 via ORAL

## 2012-04-05 MED ORDER — FENTANYL 2.5 MCG/ML BUPIVACAINE 1/10 % EPIDURAL INFUSION (WH - ANES)
INTRAMUSCULAR | Status: DC | PRN
  Administered 2012-04-05: 14 mL/h via EPIDURAL

## 2012-04-05 MED ORDER — OXYTOCIN 40 UNITS IN LACTATED RINGERS INFUSION - SIMPLE MED
62.5000 mL/h | Freq: Once | INTRAVENOUS | Status: AC
  Administered 2012-04-05: 62.5 mL/h via INTRAVENOUS
  Filled 2012-04-05: qty 1000

## 2012-04-05 MED ORDER — LANOLIN HYDROUS EX OINT: TOPICAL_OINTMENT | CUTANEOUS | Status: DC | PRN

## 2012-04-05 MED ORDER — LIDOCAINE HCL (PF) 1 % IJ SOLN
INTRAMUSCULAR | Status: DC | PRN
  Administered 2012-04-05 (×2): 8 mL

## 2012-04-05 MED ORDER — EPHEDRINE 5 MG/ML INJ
10.0000 mg | INTRAVENOUS | Status: DC | PRN
  Filled 2012-04-05: qty 4

## 2012-04-05 MED ORDER — ZOLPIDEM TARTRATE 5 MG PO TABS: 5.0000 mg | ORAL_TABLET | Freq: Every evening | ORAL | Status: DC | PRN

## 2012-04-05 MED ORDER — TETANUS-DIPHTH-ACELL PERTUSSIS 5-2.5-18.5 LF-MCG/0.5 IM SUSP: 0.5000 mL | Freq: Once | INTRAMUSCULAR | Status: DC

## 2012-04-05 MED ORDER — PRENATAL MULTIVITAMIN CH
1.0000 | ORAL_TABLET | Freq: Every day | ORAL | Status: DC
  Administered 2012-04-06 – 2012-04-07 (×2): 1 via ORAL
  Filled 2012-04-05 (×2): qty 1

## 2012-04-05 MED ORDER — EPHEDRINE 5 MG/ML INJ: 10.0000 mg | INTRAVENOUS | Status: DC | PRN

## 2012-04-05 MED ORDER — WITCH HAZEL-GLYCERIN EX PADS: 1.0000 "application " | MEDICATED_PAD | CUTANEOUS | Status: DC | PRN

## 2012-04-05 MED ORDER — LACTATED RINGERS IV SOLN
500.0000 mL | INTRAVENOUS | Status: DC | PRN
  Administered 2012-04-05: 500 mL via INTRAVENOUS

## 2012-04-05 MED ORDER — ACETAMINOPHEN 325 MG PO TABS: 650.0000 mg | ORAL_TABLET | ORAL | Status: DC | PRN

## 2012-04-05 MED ORDER — LACTATED RINGERS IV SOLN
500.0000 mL | INTRAVENOUS | Status: DC | PRN
  Administered 2012-04-05: 1000 mL/h via INTRAVENOUS

## 2012-04-05 MED ORDER — DIPHENHYDRAMINE HCL 25 MG PO CAPS: 25.0000 mg | ORAL_CAPSULE | Freq: Four times a day (QID) | ORAL | Status: DC | PRN

## 2012-04-05 MED ORDER — OXYTOCIN BOLUS FROM INFUSION
250.0000 mL | Freq: Once | INTRAVENOUS | Status: DC
  Filled 2012-04-05: qty 500

## 2012-04-05 MED ORDER — OXYTOCIN 40 UNITS IN LACTATED RINGERS INFUSION - SIMPLE MED: 62.5000 mL/h | Freq: Once | INTRAVENOUS | Status: DC

## 2012-04-05 MED ORDER — LACTATED RINGERS IV SOLN: INTRAVENOUS | Status: DC

## 2012-04-05 MED ORDER — PHENYLEPHRINE 40 MCG/ML (10ML) SYRINGE FOR IV PUSH (FOR BLOOD PRESSURE SUPPORT): 80.0000 ug | PREFILLED_SYRINGE | INTRAVENOUS | Status: DC | PRN

## 2012-04-05 MED ORDER — DIBUCAINE 1 % RE OINT: 1.0000 "application " | TOPICAL_OINTMENT | RECTAL | Status: DC | PRN

## 2012-04-05 MED ORDER — ONDANSETRON HCL 4 MG PO TABS: 4.0000 mg | ORAL_TABLET | ORAL | Status: DC | PRN

## 2012-04-05 MED ORDER — DIPHENHYDRAMINE HCL 50 MG/ML IJ SOLN: 12.5000 mg | INTRAMUSCULAR | Status: DC | PRN

## 2012-04-05 MED ORDER — BUTORPHANOL TARTRATE 1 MG/ML IJ SOLN: 1.0000 mg | INTRAMUSCULAR | Status: DC | PRN

## 2012-04-05 MED ORDER — SIMETHICONE 80 MG PO CHEW: 80.0000 mg | CHEWABLE_TABLET | ORAL | Status: DC | PRN

## 2012-04-05 NOTE — Progress Notes (Signed)
To Bs via w/c 

## 2012-04-05 NOTE — Progress Notes (Signed)
Patient ID: Michelle Hayes, female   DOB: Aug 09, 1981, 31 y.o.   MRN: [id number] .Subjective: Comfortable w epidural, declines urge to push   Objective: BP 106/54  Pulse 61  Temp 98.2 F (36.8 C) (Oral)  Resp 18  Ht 5\' 3"  (1.6 m)  Wt 121 lb (54.885 kg)  BMI 21.43 kg/m2  SpO2 98%  LMP 07/02/2011   FHT:  FHR: 130 bpm, variability: moderate,  accelerations:  Present,  decelerations:  Absent UC:   regular, every 2-3 minutes SVE:   Dilation: 8 Effacement (%): 100 Station: +2 Exam by:: nona smith  Deferred exam  Assessment / Plan: Spontaneous labor, progressing normally GBS neg Will await pt urge to push, declined AROM at present    Fetal Wellbeing:  Category I Pain Control:  Epidural  Update physician PRN  Malissa Hippo 04/05/2012, 9:45 AM

## 2012-04-05 NOTE — Anesthesia Procedure Notes (Signed)
Epidural Patient location during procedure: OB Start time: 04/05/2012 7:33 AM End time: 04/05/2012 7:38 AM Reason for block: procedure for pain  Staffing Anesthesiologist: Sandrea Hughs Performed by: anesthesiologist   Preanesthetic Checklist Completed: patient identified, site marked, surgical consent, pre-op evaluation, timeout performed, IV checked, risks and benefits discussed and monitors and equipment checked  Epidural Patient position: sitting Prep: site prepped and draped and DuraPrep Patient monitoring: continuous pulse ox and blood pressure Approach: midline Injection technique: LOR air  Needle:  Needle type: Tuohy  Needle gauge: 17 G Needle length: 9 cm Needle insertion depth: 4 cm Catheter type: closed end flexible Catheter size: 19 Gauge Catheter at skin depth: 9 cm Test dose: negative and Other  Assessment Sensory level: T10 Events: blood not aspirated, injection not painful, no injection resistance, negative IV test and no paresthesia

## 2012-04-05 NOTE — MAU Note (Signed)
Woke up about 3 hours ago and contracting since then.

## 2012-04-05 NOTE — Anesthesia Postprocedure Evaluation (Signed)
  Anesthesia Post-op Note  Patient: Michelle Hayes  Procedure(s) Performed: * No procedures listed *  Patient Location: PACU and Mother/Baby  Anesthesia Type: Epidural  Level of Consciousness: awake, alert  and oriented  Airway and Oxygen Therapy: Patient Spontanous Breathing    Post-op Assessment: Patient's Cardiovascular Status Stable and Respiratory Function Stable  Post-op Vital Signs: stable  Complications: No apparent anesthesia complications

## 2012-04-05 NOTE — Progress Notes (Signed)
Report called to Christy RN in BS 

## 2012-04-05 NOTE — Anesthesia Preprocedure Evaluation (Signed)
Anesthesia Evaluation  Patient identified by MRN, date of birth, ID band Patient awake    Reviewed: Allergy & Precautions, H&P , NPO status , Patient's Chart, lab work & pertinent test results  Airway Mallampati: I TM Distance: >3 FB Neck ROM: full    Dental No notable dental hx.    Pulmonary neg pulmonary ROS,    Pulmonary exam normal       Cardiovascular     Neuro/Psych PSYCHIATRIC DISORDERS Anxiety negative neurological ROS     GI/Hepatic negative GI ROS, Neg liver ROS,   Endo/Other  negative endocrine ROS  Renal/GU negative Renal ROS  negative genitourinary   Musculoskeletal negative musculoskeletal ROS (+)   Abdominal Normal abdominal exam  (+)   Peds  Hematology negative hematology ROS (+)   Anesthesia Other Findings   Reproductive/Obstetrics (+) Pregnancy                           Anesthesia Physical Anesthesia Plan  ASA: II  Anesthesia Plan: Epidural   Post-op Pain Management:    Induction:   Airway Management Planned:   Additional Equipment:   Intra-op Plan:   Post-operative Plan:   Informed Consent: I have reviewed the patients History and Physical, chart, labs and discussed the procedure including the risks, benefits and alternatives for the proposed anesthesia with the patient or authorized representative who has indicated his/her understanding and acceptance.     Plan Discussed with:   Anesthesia Plan Comments:         Anesthesia Quick Evaluation

## 2012-04-05 NOTE — H&P (Signed)
Michelle Hayes is a 31 y.o. female presenting for spontaneous onset of labor. Maternal Medical History:  Reason for admission: Reason for admission: contractions.  Contractions: Onset was 3-5 hours ago.   Frequency: regular.    Fetal activity: Perceived fetal activity is normal.   Last perceived fetal movement was within the past hour.    Prenatal complications: no prenatal complications Prenatal Complications - Diabetes: none.   Hx Present Pregnancy: Entered care at 11w 5d.  Pt previously had experienced some 1st trimester bldg and was evaluated at [facility] with resolution of bldg.  [place] at 18w 6d with normal anatomic findings.  Quad screen returned with increased Down Syndrome risk of 1:205.  Pt declined [facility]/amniocentesis for confirmation.  Pt with c/o "lump" under lt arm which was evaluated by ultrasound with findings of supernummary nipple and fibroglandular tissue .  Pt with elevated 1hr GTT of 148 and 3hr GTT with 1 abn value.  Ultrasound performed at 34wks due to sm for dates and fetal growth at 49th%ile. GBS negative.  Remainder of prenatal course unremarkable.  OB History    Grav Para Term Preterm Abortions TAB SAB Ect Mult Living   4 1 1  2  0 1   1     Past Medical History  Diagnosis Date  . Heart murmur   . Anemia   . Anxiety 2010    panic attacks  . Anemia   . Irregular heart beat   . H/O rubella   . H/O varicella   . H/O hepatitis    History reviewed. No pertinent past surgical history. Family History: family history includes Cancer in her maternal grandfather and paternal grandfather; Depression in her maternal grandmother; and Hypertension in her maternal grandmother.  There is no history of Other. Social History:  reports that she has never smoked. She has never used smokeless tobacco. She reports that she does not drink alcohol or use illicit drugs.   Prenatal Transfer Tool  Maternal Diabetes: No Genetic Screening: Abnormal:  Results: Elevated risk of Trisomy  21 Maternal Ultrasounds/Referrals: Normal Fetal Ultrasounds or other Referrals:  Other: see prenatal record Maternal Substance Abuse:  No Significant Maternal Medications:  None Significant Maternal Lab Results:  None Other Comments:  None  Review of Systems  Constitutional: Negative.   HENT: Negative.   Eyes: Negative.   Respiratory: Negative.   Cardiovascular: Negative.   Gastrointestinal: Negative.   Genitourinary: Negative.   Musculoskeletal: Negative.   Skin: Negative.   Neurological: Negative.   Endo/Heme/Allergies: Negative.   Psychiatric/Behavioral: Negative.     Dilation: 8 Effacement (%): 100 Station: +1 Exam by:: Elsie Ra CNM Blood pressure 124/70, pulse 73, temperature 97.3 F (36.3 C), temperature source Oral, resp. rate 20, height 5\' 3"  (1.6 m), weight 55.248 kg (121 lb 12.8 oz), last menstrual period 07/02/2011. Maternal Exam:  Uterine Assessment: Contraction strength is firm.  Contraction frequency is regular.   Abdomen: Patient reports no abdominal tenderness. Fundal height is 39.   Estimated fetal weight is 7.5#.   Fetal presentation: vertex  Introitus: Normal vulva. Normal vagina.  Ferning test: not done.  Amniotic fluid character: not assessed.  Pelvis: adequate for delivery.   Cervix: Cervix evaluated by digital exam.     Fetal Exam Fetal Monitor Review: Mode: ultrasound.   Baseline rate: 135.  Variability: moderate (6-25 bpm).   Pattern: accelerations present and no decelerations.    Fetal State Assessment: Category I - tracings are normal.     Physical Exam  Constitutional: She is oriented to person, place, and time. She appears well-developed and well-nourished.  HENT:  Head: Normocephalic and atraumatic.  Right Ear: External ear normal.  Left Ear: External ear normal.  Eyes: Conjunctivae are normal. Pupils are equal, round, and reactive to light.  Neck: Normal range of motion. Neck supple. No thyromegaly present.   Cardiovascular: Normal rate, regular rhythm and intact distal pulses.   Respiratory: Effort normal and breath sounds normal.  GI: Soft. Bowel sounds are normal.       Gravid, NT  Genitourinary: Vagina normal and uterus normal.  Musculoskeletal: Normal range of motion.  Neurological: She is alert and oriented to person, place, and time. She has normal reflexes.  Skin: Skin is warm and dry.  Psychiatric: She has a normal mood and affect. Her behavior is normal.    Prenatal labs: ABO, Rh: A/Positive/-- (12/28 0000) Antibody: Negative (12/28 0000) Rubella: Immune (12/28 0000) RPR: NON REAC (04/29 1140)  HBsAg: Negative (12/28 0000)  HIV: Non-reactive (12/28 0000)  GBS: NEGATIVE (07/02 1030)   Assessment/Plan: IUP at 39w 5d Active labor  Admit to birthing suites per consult with Dr. Stefano Gaul. Pt desires epidural placement if possible. Routine admission orders.     Dakiya Puopolo O. 04/05/2012, 6:44 AM

## 2012-04-06 LAB — CBC
MCH: 31.8 pg (ref 26.0–34.0)
Platelets: 163 10*3/uL (ref 150–400)
RBC: 3.58 MIL/uL — ABNORMAL LOW (ref 3.87–5.11)
RDW: 13.5 % (ref 11.5–15.5)
WBC: 8.5 10*3/uL (ref 4.0–10.5)

## 2012-04-06 NOTE — Progress Notes (Signed)
Post Partum Day 1 Subjective: up ad lib, voiding, tolerating PO, + flatus and newborn just returned from getting hearing screen.  VB stable.  Older son and s.o. at bedside.  Pt reports no previous use of hormonal contraception.   Objective: Blood pressure 100/62, pulse 65, temperature 98.1 F (36.7 C), temperature source Oral, resp. rate 18, height 5\' 3"  (1.6 m), weight 121 lb (54.885 kg), last menstrual period 07/02/2011, SpO2 98.00%, unknown if currently breastfeeding.  Physical Exam:  General: alert, cooperative and no distress Lochia: appropriate, rubra Uterine Fundus: firm; u/-2; firm w/ massage Incision: n/a DVT Evaluation: No evidence of DVT seen on physical exam. Negative Homan's sign. No significant calf/ankle edema.   Basename 04/06/12 0545 04/05/12 0630  HGB 11.4* 12.8  HCT 34.9* 39.0    Assessment/Plan: Plan for discharge tomorrow, Breastfeeding and Lactation consult   LOS: 1 day   Adam Sanjuan H 04/06/2012, 1:19 PM

## 2012-04-07 MED ORDER — IBUPROFEN 600 MG PO TABS: 600.0000 mg | ORAL_TABLET | Freq: Four times a day (QID) | ORAL | Status: AC | PRN

## 2012-04-07 NOTE — Progress Notes (Signed)

## 2012-04-07 NOTE — Discharge Summary (Signed)
Obstetric Discharge Summary Reason for Admission: onset of labor Prenatal Procedures: ultrasound Intrapartum Procedures: spontaneous vaginal delivery Postpartum Procedures: none Complications-Operative and Postpartum: 1st  degree perineal laceration Hemoglobin  Date Value Range Status  04/06/2012 11.4* 12.0 - 15.0 g/dL Final     HCT  Date Value Range Status  04/06/2012 34.9* 36.0 - 46.0 % Final   Hospital Course: Admitted7/28/13. neg GBS. Progressed  Normally. Delivery was performed by Sanda Klein without complication. Patient and baby tolerated the procedure without difficulty, with  1st degree laceration noted. Infant to FTN. Mother and infant then had an uncomplicated postpartum course, with breast feeding going well. Mom's physical exam was WNL, and she was discharged home in stable condition. Contraception plan was undecided.  She received adequate benefit from po pain medications.     Physical Exam:  General: alert and cooperative Lochia: appropriate Uterine Fundus: firm Incision: healing well DVT Evaluation: No evidence of DVT seen on physical exam.  Discharge Diagnoses: Term Pregnancy-delivered  Discharge Information: Date: 04/07/2012 Activity: per [facility] handout Diet: routine Medications: PNV and Ibuprofen Condition: stable Instructions: refer to practice specific booklet Discharge to: home Contraception: undecided and information given  Follow-up Information    Follow up with [facility]. (As needed)          Newborn Data: Live born female  Birth Weight: 7 lb 2.8 oz (3255 g) APGAR: 9, 9  Home with mother.  Michelle Hayes 04/07/2012, 7:46 AM

## 2012-04-07 NOTE — Progress Notes (Signed)
Discharged Home per MD orders , Mom and baby arm bands checked by Nurse Tech. [id number] # [phone].

## 2012-04-10 ENCOUNTER — Encounter: Payer: BC Managed Care – PPO | Admitting: Obstetrics and Gynecology

## 2012-04-28 ENCOUNTER — Other Ambulatory Visit: Payer: Self-pay | Admitting: Obstetrics and Gynecology

## 2012-05-14 ENCOUNTER — Encounter: Payer: Self-pay | Admitting: Obstetrics and Gynecology

## 2012-05-14 ENCOUNTER — Ambulatory Visit (INDEPENDENT_AMBULATORY_CARE_PROVIDER_SITE_OTHER): Payer: BC Managed Care – PPO | Admitting: Obstetrics and Gynecology

## 2012-05-14 DIAGNOSIS — N8189 Other female genital prolapse: Secondary | ICD-10-CM

## 2012-05-14 MED ORDER — NORETHINDRONE 0.35 MG PO TABS: 1.0000 | ORAL_TABLET | Freq: Every day | ORAL | Status: DC

## 2012-05-14 NOTE — Progress Notes (Addendum)
Date of delivery: 04/05/2012 Female Name: Michelle Hayes Vaginal delivery:yes Cesarean section:no Tubal ligation:no GDM:no Breast Feeding:yes Bottle Feeding:no Post-Partum Blues:no Abnormal pap:no Normal GU function: yes Normal GI function:yes Returning to work:yes EPDS:  2  Filed Vitals:   05/14/12 1058  BP: 98/56   ROS: noncontributory  Pelvic exam:  VULVA: normal appearing vulva with no masses, tenderness or lesions,  VAGINA: normal appearing vagina with normal color and discharge, no lesions, CERVIX: normal appearing cervix without discharge or lesions,  UTERUS: uterus is normal size, shape, consistency and nontender, stage 2-3 uterine prolapse  ADNEXA: normal adnexa in size, nontender and no masses.  A/P Pelvic Relaxation - recs and options discussed - questions answered AEX at pt's convenience [employer]

## 2012-07-03 IMAGING — US US OB TRANSVAGINAL
1 series · 13 of 28 positions shown · non-contrast
Comparison: None.

presenting with vaginal bleeding.

OBSTETRIC <14 WK US AND TRANSVAGINAL OB US 08/17/2011:
TECHNIQUE: Both transabdominal and transvaginal ultrasound
examinations were performed for complete evaluation of the
gestation as well as the maternal uterus, adnexal regions, and
pelvic cul-de-sac.  Transvaginal technique was performed to assess
early pregnancy.

[Series 1: us ob comp less 14 wks · 13 of 44 slices shown]
[im 2/44]
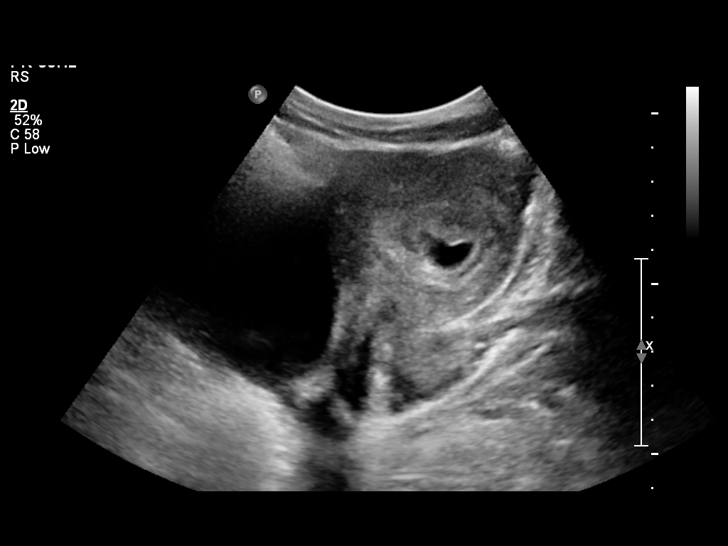
[im 5/44]
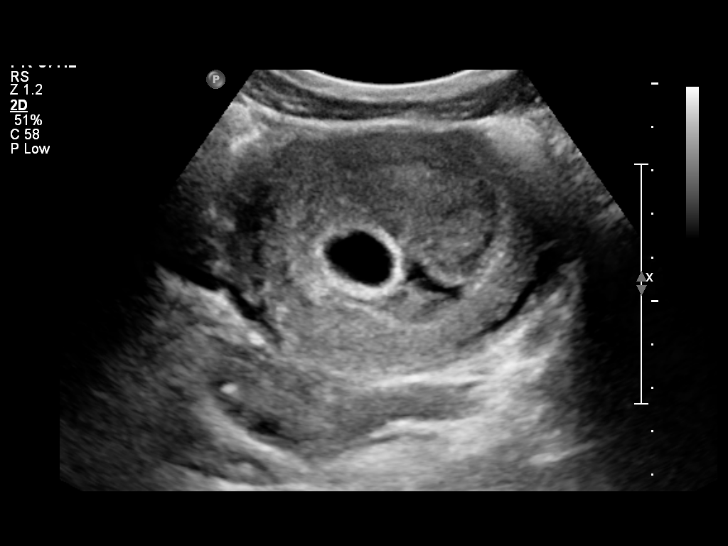
[im 8/44]
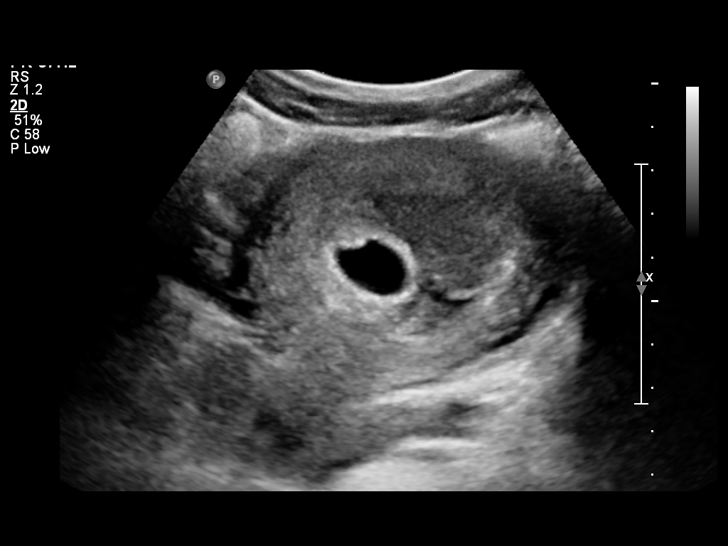
[im 12/44]
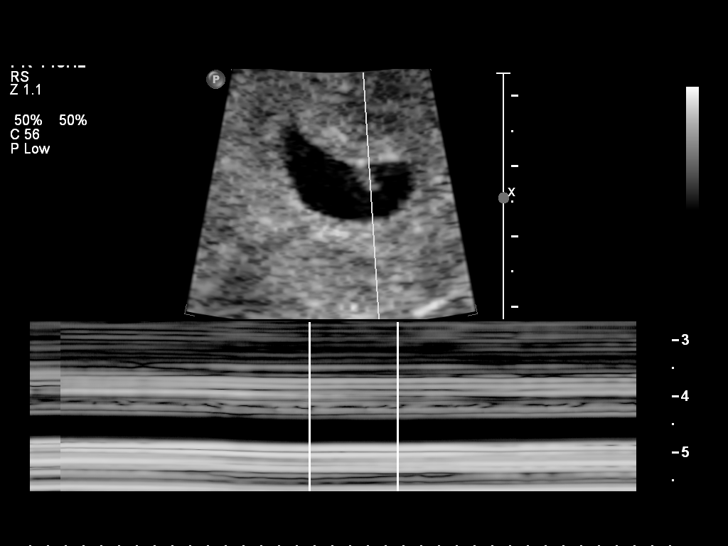
[im 15/44]
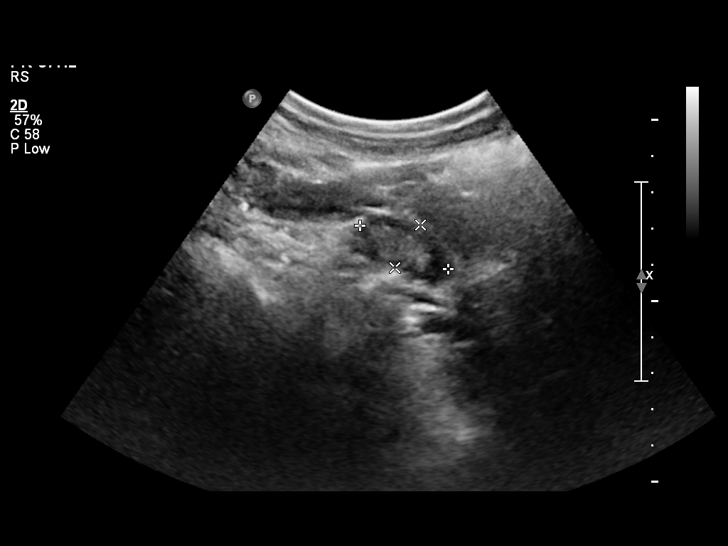
[im 18/44]
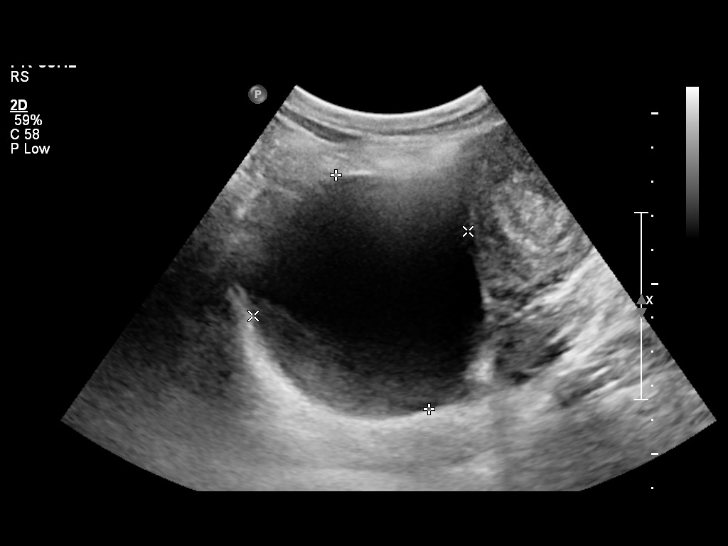
[im 23/44]
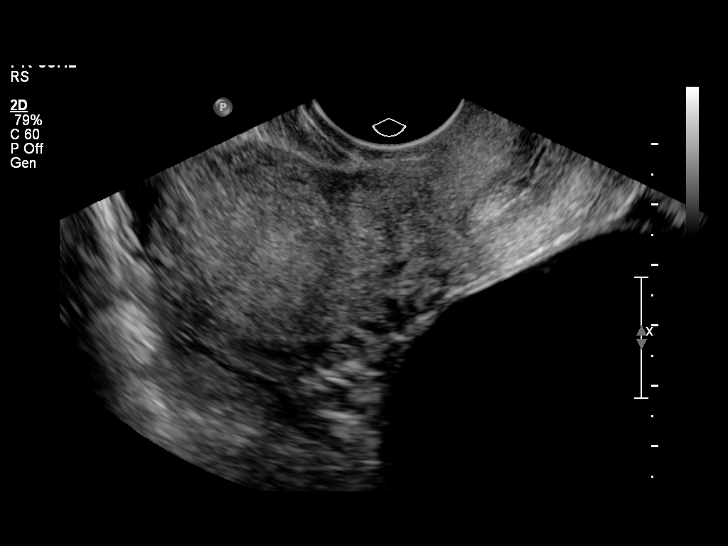
[im 26/44]
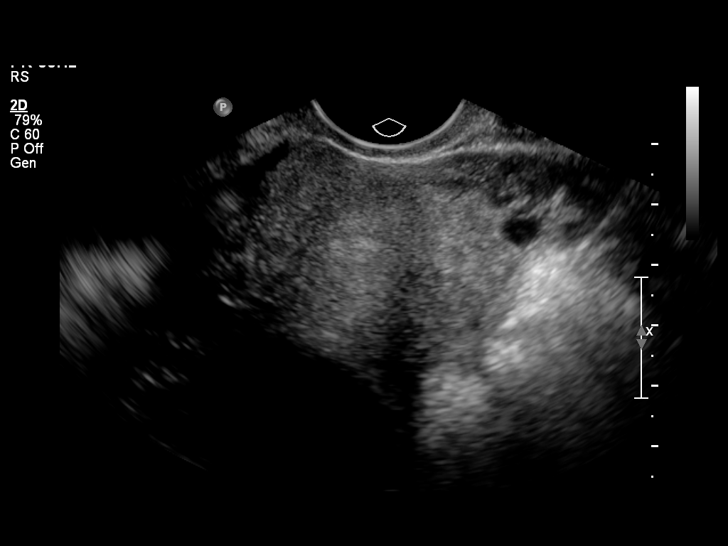
[im 29/44]
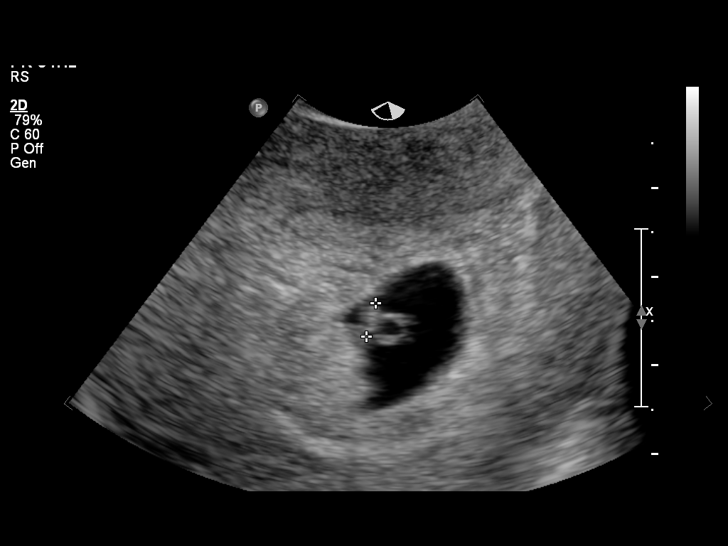
[im 32/44]
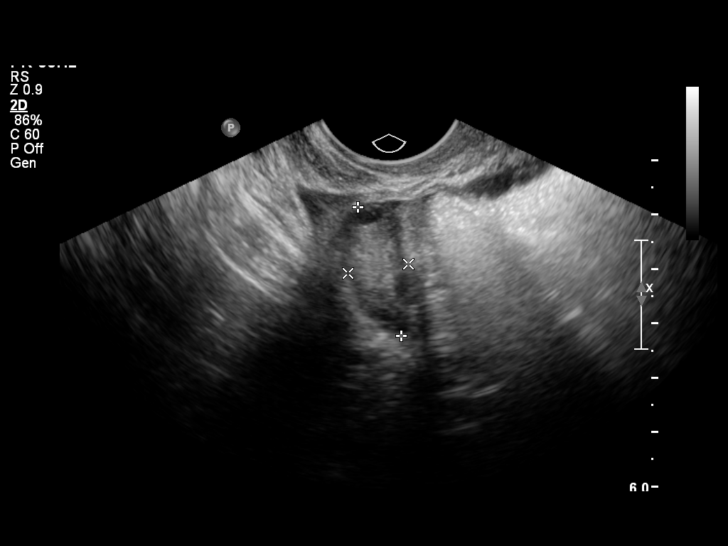
[im 36/44]
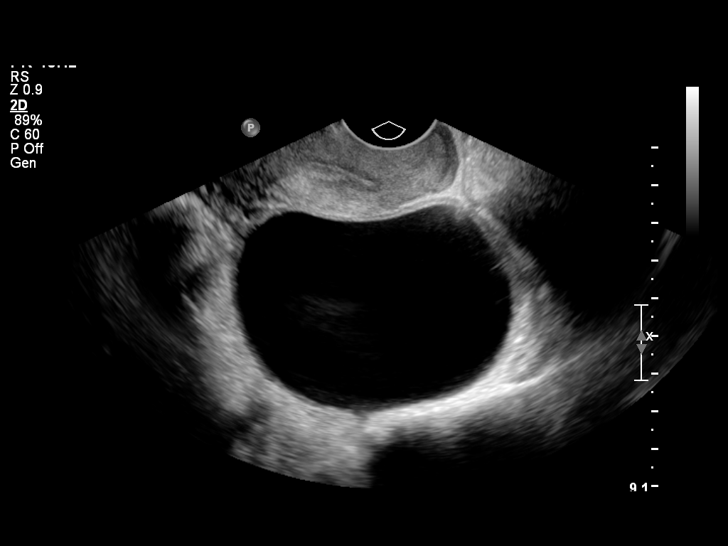
[im 39/44]
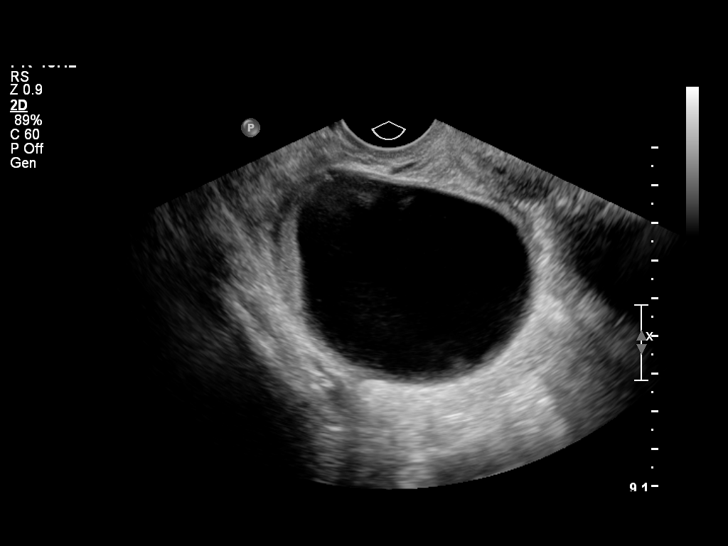
[im 42/44]
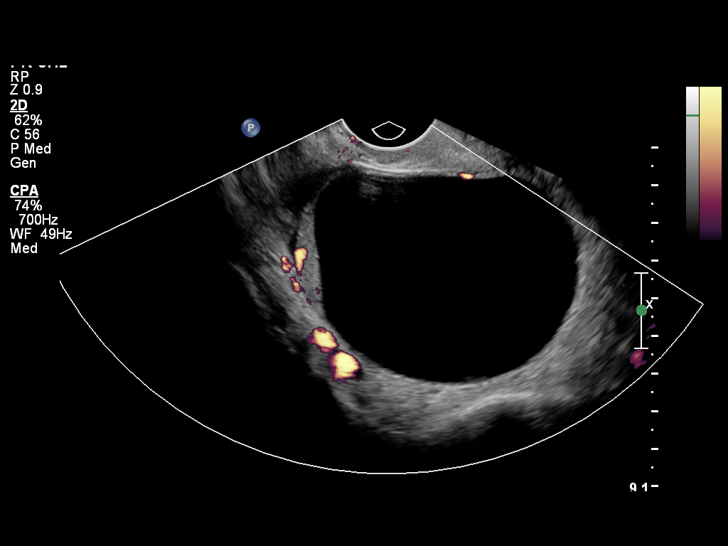

[13 of 28 positions shown; findings below may reference images not displayed]

Intrauterine gestational sac:  Single, normal in shape.
Yolk sac: Visualized.
Embryo: Visualized.
Cardiac Activity: Visualized.
Heart Rate: 120 bpm

CRL: 3.9 mm  6 w  1 d         US EDC: 04/10/2012.

Maternal uterus/adnexae:
Enlarged right ovary measuring approximately 7.6 x 5.43 x 7.1 cm,
containing an approximate 7.3 x 5.3 x 6.0 cm simple cyst.  Normal
power Doppler signal within the right ovary.  Left ovary normal in
size and appearance, containing small follicular cyst, measuring
approximately 2.5 x 1.1 x 1.8 cm.  No other adnexal masses.  No
free pelvic fluid.
IMPRESSION: 1.  Single live intrauterine embryo with estimated gestational age
6 weeks 1 day by crown-rump length, correlating well with the
estimated gestational age by LMP.  Ultrasound EDC 04/10/2012.
2.  Approximate 7 cm simple cyst arising from the right ovary.

## 2013-02-17 ENCOUNTER — Other Ambulatory Visit (INDEPENDENT_AMBULATORY_CARE_PROVIDER_SITE_OTHER): Payer: BC Managed Care – PPO

## 2013-02-17 ENCOUNTER — Encounter: Payer: Self-pay | Admitting: Pulmonary Disease

## 2013-02-17 ENCOUNTER — Ambulatory Visit (INDEPENDENT_AMBULATORY_CARE_PROVIDER_SITE_OTHER)
Admission: RE | Admit: 2013-02-17 | Discharge: 2013-02-17 | Disposition: A | Discharge status: Home / Self Care | Payer: BC Managed Care – PPO | Source: Ambulatory Visit | Attending: Pulmonary Disease | Admitting: Pulmonary Disease

## 2013-02-17 ENCOUNTER — Ambulatory Visit (INDEPENDENT_AMBULATORY_CARE_PROVIDER_SITE_OTHER): Payer: BC Managed Care – PPO | Admitting: Pulmonary Disease

## 2013-02-17 VITALS — BP 110/58 | HR 93 | Temp 98.9°F | Ht 62.0 in | Wt 100.8 lb

## 2013-02-17 DIAGNOSIS — R233 Spontaneous ecchymoses: Secondary | ICD-10-CM

## 2013-02-17 DIAGNOSIS — R059 Cough, unspecified: Secondary | ICD-10-CM

## 2013-02-17 DIAGNOSIS — R05 Cough: Secondary | ICD-10-CM

## 2013-02-17 LAB — COMPREHENSIVE METABOLIC PANEL
ALT: 25 U/L (ref 0–35)
Albumin: 4.5 g/dL (ref 3.5–5.2)
CO2: 25 mEq/L (ref 19–32)
Calcium: 10.2 mg/dL (ref 8.4–10.5)
Chloride: 104 mEq/L (ref 96–112)
GFR: 101.6 mL/min (ref >60.00)
Glucose, Bld: 82 mg/dL (ref 70–99)
Potassium: 3.8 mEq/L (ref 3.5–5.1)
Sodium: 140 mEq/L (ref 135–145)
Total Protein: 8.2 g/dL (ref 6.0–8.3)

## 2013-02-17 LAB — CBC WITH DIFFERENTIAL/PLATELET
Eosinophils Relative: 1.9 % (ref 0.0–5.0)
HCT: 42.3 % (ref 36.0–46.0)
Lymphocytes Relative: 25.5 % (ref 12.0–46.0)
Lymphs Abs: 1.6 10*3/uL (ref 0.7–4.0)
Monocytes Relative: 3.5 % (ref 3.0–12.0)
Platelets: 215 10*3/uL (ref 150.0–400.0)
WBC: 6.4 10*3/uL (ref 4.5–10.5)

## 2013-02-17 LAB — PROTIME-INR: Prothrombin Time: 12 s (ref 10.2–12.4)

## 2013-02-17 MED ORDER — MOMETASONE FUROATE 50 MCG/ACT NA SUSP: 2.0000 | Freq: Every day | NASAL | Status: DC

## 2013-02-17 NOTE — Progress Notes (Signed)
Subjective:    Patient ID: Michelle Hayes, female    DOB: [birth year], 32 y.o.   MRN: [id number]  HPI  Self referral.  32 year old [place] woman, never smoker presents for evaluation of chronic cough Pt c/o cough w. clear-yellow phlem x 6 months. C/o left side chest Pain, not related to exertion but also could be d/t stress. She does have SOB but not sure if it is d/t stress/anxiety attack. She was diagnosed with panic attacks years ago. She wonders if cough may be related to frequent infections that she acquires from her child.she has 2 children age 11 years and 10 months. 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 Her PPD status is unknown but she denies fevers or weight loss. She reports spontaneous bruising over her thighs and legs which have now resolved. She had two spontaneous vaginal deliveries and 1 first trimester loss. I note that her platelets were 160 K. But on repeat today are 215K, if her blood counts are normal. DT INR is normal but PTT slightly increased  She denies seasonal allergies or postnasal drip . She denies obvious heartburn. She is lactating and has concerns about any prescriptions I might give her.  Past Medical History  Diagnosis Date  . Heart murmur   . Anemia   . Anxiety 2010    panic attacks  . Anemia   . Irregular heart beat   . H/O rubella   . H/O varicella   . H/O hepatitis     No past surgical history on file.  Allergies  Allergen Reactions  . Dust Mite Extract Other (See Comments)    Teary eyed  . Other     Cats   . Pollen Extract     History   Social History  . Marital Status: Married    Spouse Name: N/A    Number of Children: N/A  . Years of Education: N/A   Occupational History  . Not on file.   Social History Main Topics  . Smoking status: Never Smoker   . Smokeless tobacco: Never Used  . Alcohol Use: No  . Drug Use: No  . Sexually Active: Yes    Birth Control/ Protection: None   Other Topics  Concern  . Not on file   Social History Narrative  . No narrative on file    Family History  Problem Relation Age of Onset  . Colon cancer Maternal Grandfather     colon cancer  . Hypertension Maternal Grandmother   . Depression Maternal Grandmother   . Cancer Paternal Grandfather   . Other Neg Hx   . Hypertension Maternal Grandmother      Review of Systems  Constitutional: Positive for unexpected weight change. Negative for appetite change.  HENT: Positive for congestion. Negative for ear pain, sore throat, sneezing, trouble swallowing and dental problem.   Respiratory: Positive for cough and shortness of breath.   Cardiovascular: Positive for chest pain. Negative for palpitations and leg swelling.  Gastrointestinal: Negative for abdominal pain.  Musculoskeletal: Negative for joint swelling.  Skin: Negative for rash.  Neurological: Negative for headaches.  Psychiatric/Behavioral: Negative for dysphoric mood. The patient is nervous/anxious.        Objective:   Physical Exam  Gen. Pleasant, well-nourished, in no distress, normal affect ENT - no lesions, no post nasal drip Neck: No JVD, no thyromegaly, no carotid bruits Lungs: no use of accessory muscles, no dullness to percussion, clear without rales or  rhonchi  Cardiovascular: Rhythm regular, heart sounds  normal, no murmurs or gallops, no peripheral edema Abdomen: soft and non-tender, no hepatosplenomegaly, BS normal. Musculoskeletal: No deformities, no cyanosis or clubbing Neuro:  alert, non focal       Assessment & Plan:

## 2013-02-17 NOTE — Assessment & Plan Note (Addendum)
CXR today was nml If CXR normal, your cough may be related to allergies or reflux Nasal spray -nasonex - each nare at bedtime - minimal passage into milk If this is not effective, can use zyrtec Sudafed does pass in milk Blood work for allergies

## 2013-02-17 NOTE — Assessment & Plan Note (Signed)
Platelets noted to be 160 K. Prior, but improved to 215 k PTT slight high- may need mixing studies and hematology referral

## 2013-02-17 NOTE — Patient Instructions (Addendum)
CXR today If CXR normal, your cough may be related to allergies or reflux Nasal spray -nasonex - each nare at bedtime - minimal passage into milk If this is not effective, can use zyrtec Sudafed does pass in milk Blood work for allergies Trail of DELSYm in meantime 2 tsp as needed twice daily

## 2013-02-18 ENCOUNTER — Telehealth: Payer: Self-pay | Admitting: Pulmonary Disease

## 2013-02-18 LAB — ALLERGY FULL PROFILE
Allergen,Goose feathers, e70: <0.1 kU/L
Bermuda Grass: <0.1 kU/L
Box Elder IgE: 0.9 kU/L — ABNORMAL HIGH
Candida Albicans: <0.1 kU/L
Common Ragweed: 1.27 kU/L — ABNORMAL HIGH
Dog Dander: 0.3 kU/L — ABNORMAL HIGH
G005 Rye, Perennial: <0.1 kU/L
Goldenrod: <0.1 kU/L
Helminthosporium halodes: <0.1 kU/L
IgE (Immunoglobulin E), Serum: 144.4 IU/mL (ref 0.0–180.0)
Oak: 10.2 kU/L — ABNORMAL HIGH
Plantain: <0.1 kU/L
Stemphylium Botryosum: <0.1 kU/L
Timothy Grass: <0.1 kU/L

## 2013-02-18 NOTE — Telephone Encounter (Signed)
I spoke with patient about results and she verbalized understanding and had no questions 

## 2013-02-23 ENCOUNTER — Telehealth: Payer: Self-pay | Admitting: Pulmonary Disease

## 2013-02-23 NOTE — Telephone Encounter (Signed)
Notes Recorded by Tommie Sams, CMA on 02/23/2013 at 9:21 AM [id number] x1 ------  Notes Recorded by Oretha Milch, MD on 02/19/2013 at 1:11 AM Strong positive for dust mite & oak, low grade to other trees Zyrtec if persistent symptoms  Pt advised. Carron Curie, CMA

## 2013-03-18 ENCOUNTER — Ambulatory Visit (INDEPENDENT_AMBULATORY_CARE_PROVIDER_SITE_OTHER): Payer: BC Managed Care – PPO | Admitting: Pulmonary Disease

## 2013-03-18 ENCOUNTER — Encounter: Payer: Self-pay | Admitting: Pulmonary Disease

## 2013-03-18 VITALS — BP 108/62 | HR 103 | Temp 98.2°F | Ht 62.0 in | Wt 99.2 lb

## 2013-03-18 DIAGNOSIS — R059 Cough, unspecified: Secondary | ICD-10-CM

## 2013-03-18 DIAGNOSIS — R233 Spontaneous ecchymoses: Secondary | ICD-10-CM

## 2013-03-18 DIAGNOSIS — R05 Cough: Secondary | ICD-10-CM

## 2013-03-18 MED ORDER — AZITHROMYCIN 250 MG PO TABS: ORAL_TABLET | ORAL | Status: DC

## 2013-03-18 MED ORDER — MOMETASONE FUROATE 220 MCG/INH IN AEPB: 2.0000 | INHALATION_SPRAY | Freq: Every day | RESPIRATORY_TRACT | Status: DC

## 2013-03-18 NOTE — Progress Notes (Signed)
  Subjective:    Patient ID: Michelle Hayes, female    DOB: 09/07/1981, 32 y.o.   MRN: [id number]  HPI Self referral.  32 year old [place] woman, never smoker presents for evaluation of chronic cough x 8 mnths Pt c/o cough w. clear-yellow phlem x 6 months. C/o left side chest Pain, not related to exertion but also could be d/t stress. She does have SOB but not sure if it is d/t stress/anxiety attack. She was diagnosed with panic attacks years ago.  She wonders if cough may be related to frequent infections that she acquires from her child.she has 2 children age 6 years and 10 months. 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 Her PPD status is unknown but she denies fevers or weight loss. She reports spontaneous bruising over her thighs and legs which have now resolved. She had two spontaneous vaginal deliveries and 1 first trimester loss. I note that her platelets were 160 K. But on repeat - 215K,  her blood counts are normal. PT INR is normal but PTT slightly increased   Test data - CXR nml RAST -Strong positive for dust mite & oak, low grade to other trees >>Zyrtec if persistent symptoms CBC -nml, no eosinophilia  Pt still coughing and at times she brings up thick yellow phlem. Her cough was getting better. Start getting worse x 2 days. pt is still bruising.  She denies seasonal allergies or postnasal drip . She denies obvious heartburn.  She is lactating and has concerns about any prescriptions I might give her. In fact , she did not take the zyrtec or flonase that I had given her. Continues to have bruising   Review of Systems neg for any significant sore throat, dysphagia, itching, sneezing, nasal congestion or excess/ purulent secretions, fever, chills, sweats, unintended wt loss, pleuritic or exertional cp, hempoptysis, orthopnea pnd or change in chronic leg swelling. Also denies presyncope, palpitations, heartburn, abdominal pain, nausea, vomiting, diarrhea or  change in bowel or urinary habits, dysuria,hematuria, rash, arthralgias, visual complaints, headache, numbness weakness or ataxia.     Objective:   Physical Exam  Gen. Pleasant, well-nourished, in no distress ENT - no lesions, no post nasal drip Neck: No JVD, no thyromegaly, no carotid bruits Lungs: no use of accessory muscles, no dullness to percussion, clear without rales or rhonchi  Cardiovascular: Rhythm regular, heart sounds  normal, no murmurs or gallops, no peripheral edema Musculoskeletal: No deformities, no cyanosis or clubbing        Assessment & Plan:

## 2013-03-18 NOTE — Assessment & Plan Note (Addendum)
?   Upper airway cough most likely  Consider eosinophilic bronchitis   I discussed that treatment trial would be the best way forward rather than more testing at this time. Meds I chose would be low risk during lactation  Sputum sample for culture Trial of Z-pak x 5days (antibiotic) Trial of Qvar inhaler 40 once daily -RINSE mouth after Call back in 2 weeks to report  Can take DELSYM 2 tsp twice daily as needed for cough

## 2013-03-18 NOTE — Patient Instructions (Addendum)
Sputum sample for culture Trial of Z-pak x 5days (antibiotic) Trial of Qvar inhaler 40 once daily -RINSE mouth after Call back in 2 weeks to report  Can take DELSYM 2 tsp twice daily as needed for cough Hematology referral

## 2013-03-18 NOTE — Assessment & Plan Note (Signed)
Platelets noted to be 160 K. Prior, but improved to 215 k PTT slight high- may need mixing studies and hematology referral

## 2013-03-23 ENCOUNTER — Telehealth: Payer: Self-pay | Admitting: Oncology

## 2013-03-23 NOTE — Telephone Encounter (Signed)
PT CALLED TO SCHEDULE APPT FOR 08/19 @ 10:30 W/DR. SHADAD.  REFERRING DR. Vassie Loll DX- SPONTANEOUS BRUISING WELCOME PACKET MAILED.

## 2013-03-26 ENCOUNTER — Telehealth: Payer: Self-pay | Admitting: Pulmonary Disease

## 2013-03-26 NOTE — Telephone Encounter (Signed)
Will forward to Dr. Vassie Loll so he is aware

## 2013-03-26 NOTE — Telephone Encounter (Signed)
ok 

## 2013-03-31 ENCOUNTER — Other Ambulatory Visit: Payer: BC Managed Care – PPO

## 2013-03-31 ENCOUNTER — Telehealth: Payer: Self-pay | Admitting: Pulmonary Disease

## 2013-03-31 DIAGNOSIS — R059 Cough, unspecified: Secondary | ICD-10-CM

## 2013-03-31 DIAGNOSIS — R05 Cough: Secondary | ICD-10-CM

## 2013-03-31 NOTE — Telephone Encounter (Signed)
Spoke with the pt She states that she is starting to produce sputum again, enough to leave sufficient amount for culture  I advised order is still good in the computer and should come by the lab and provide specimen Pt verbalized understanding  Will forward to RA to make him aware

## 2013-03-31 NOTE — Telephone Encounter (Signed)
ok 

## 2013-04-03 LAB — LOWER RESPIRATORY CULTURE

## 2013-04-19 ENCOUNTER — Telehealth: Payer: Self-pay | Admitting: Pulmonary Disease

## 2013-04-19 NOTE — Telephone Encounter (Signed)
Sputum cx neg Have discussed blood work on visit incl RAST  Is there seomthing specific she wants to know?

## 2013-04-19 NOTE — Telephone Encounter (Signed)
Spoke to pt. She is aware that her results are back but RA has not had the chance to look at them. Advised that I would send this message to him to get him to take a look at them.  In regards to her Hematology appointment, the appointment has been set for 04/27/13 but she was never told where she needed to go. The address for the [facility] was given to the pt.  RA - please look at the pt's sputum/blood work results. She is very eager to know these results. Thanks.

## 2013-04-20 NOTE — Telephone Encounter (Signed)
Pt returned call.  Spoke with patient, advised of neg sputum cx results as stated by RA below.  Pt stated that she has no other questions at this time; those are the only results she was inquiring after.  Nothing further needed; will sign off.

## 2013-04-20 NOTE — Telephone Encounter (Signed)
LMTCB x1 for pt.  

## 2013-04-23 ENCOUNTER — Other Ambulatory Visit: Payer: Self-pay | Admitting: Oncology

## 2013-04-23 DIAGNOSIS — O209 Hemorrhage in early pregnancy, unspecified: Secondary | ICD-10-CM

## 2013-04-23 DIAGNOSIS — O039 Complete or unspecified spontaneous abortion without complication: Secondary | ICD-10-CM

## 2013-04-27 ENCOUNTER — Other Ambulatory Visit (HOSPITAL_BASED_OUTPATIENT_CLINIC_OR_DEPARTMENT_OTHER): Payer: BC Managed Care – PPO | Admitting: Lab

## 2013-04-27 ENCOUNTER — Encounter: Payer: Self-pay | Admitting: Oncology

## 2013-04-27 ENCOUNTER — Telehealth: Payer: Self-pay | Admitting: Oncology

## 2013-04-27 ENCOUNTER — Ambulatory Visit (HOSPITAL_BASED_OUTPATIENT_CLINIC_OR_DEPARTMENT_OTHER): Payer: BC Managed Care – PPO | Admitting: Oncology

## 2013-04-27 ENCOUNTER — Ambulatory Visit (HOSPITAL_BASED_OUTPATIENT_CLINIC_OR_DEPARTMENT_OTHER): Payer: BC Managed Care – PPO

## 2013-04-27 VITALS — BP 115/76 | HR 90 | Temp 98.3°F | Resp 18 | Ht 62.0 in | Wt 98.3 lb

## 2013-04-27 DIAGNOSIS — O039 Complete or unspecified spontaneous abortion without complication: Secondary | ICD-10-CM

## 2013-04-27 DIAGNOSIS — R233 Spontaneous ecchymoses: Secondary | ICD-10-CM

## 2013-04-27 DIAGNOSIS — O209 Hemorrhage in early pregnancy, unspecified: Secondary | ICD-10-CM

## 2013-04-27 LAB — COMPREHENSIVE METABOLIC PANEL (CC13)
ALT: 24 U/L (ref 0–55)
AST: 22 U/L (ref 5–34)
Albumin: 3.8 g/dL (ref 3.5–5.0)
Alkaline Phosphatase: 74 U/L (ref 40–150)
Potassium: 4 mEq/L (ref 3.5–5.1)
Sodium: 141 mEq/L (ref 136–145)
Total Bilirubin: 0.97 mg/dL (ref 0.20–1.20)
Total Protein: 7.4 g/dL (ref 6.4–8.3)

## 2013-04-27 LAB — PROTHROMBIN TIME
INR: 0.96 (ref <1.50)
Prothrombin Time: 12.6 seconds (ref 11.6–15.2)

## 2013-04-27 LAB — CBC WITH DIFFERENTIAL/PLATELET
BASO%: 0.5 % (ref 0.0–2.0)
Eosinophils Absolute: 0.1 10*3/uL (ref 0.0–0.5)
LYMPH%: 37.8 % (ref 14.0–49.7)
MCHC: 33.4 g/dL (ref 31.5–36.0)
MCV: 94.4 fL (ref 79.5–101.0)
MONO%: 6.2 % (ref 0.0–14.0)
NEUT#: 2.3 10*3/uL (ref 1.5–6.5)
Platelets: 231 10*3/uL (ref 145–400)
RBC: 4.23 10*6/uL (ref 3.70–5.45)
RDW: 13.1 % (ref 11.2–14.5)
WBC: 4.3 10*3/uL (ref 3.9–10.3)

## 2013-04-27 NOTE — Telephone Encounter (Signed)
gv and printed appt sched and avs for pt  °

## 2013-04-27 NOTE — Progress Notes (Signed)
Checked in new patient with no financial issues. email address on file is correct and she may do mychart. Phone and mail also.

## 2013-04-27 NOTE — Progress Notes (Signed)
Reason for Referral: Spontaneous bruising an elevated PTT.   HPI: This is a pleasant 32 year old [place] woman referred to me for the evaluation for spontaneous bruising. She is a rather healthy woman but developed diffuse pruritus, rash and occasional ecchymosis in the last year or so. She notices more after her recent delivery of her 70-year-old daughter. She had been breast-feeding and noticed a lot of stress in her life and that's when these ecchymoses were noted. She is reporting improvement in the last few months but this still rather prominent. She did notice some few years ago and improved spontaneously after few months. She does not report any active bleeding at this time. She did not report easy bleeding previously. She did report a hematoma that developed after the birth of her first child but no complications with her second child. She had not reported any epistaxis hemoptysis hematochezia or melena. She did not report any bleeding after dental extractions. She never really had any heavy menstrual cycles that she recalls. She was noted recently that she has a mildly elevated PTT of 37 he upper limit of normal is 28 with a normal PT.  Clinically, she has been doing relatively fair. She does report some respiratory symptoms and have been evaluated by pulmonary medicine for chronic cough and possible allergies. She is not reporting any weight loss or appetite changes. She had not reported any GI or GU symptoms. She is able to perform most activities of daily living without any hindrance or decline.   Past Medical History  Diagnosis Date  . Heart murmur   . Anemia   . Anxiety 2010    panic attacks  . Anemia   . Irregular heart beat   . H/O rubella   . H/O varicella   . H/O hepatitis   :  No past surgical history on file.:  Current Outpatient Prescriptions  Medication Sig Dispense Refill  . Prenat w/o A-FE-DSS-Methfol-FA (PRENATAL MULTIVITAMIN) 90-600-400 MG-MCG-MCG tablet Take 1  tablet by mouth daily.        Marland Kitchen azithromycin (ZITHROMAX) 250 MG tablet Take as directed  6 tablet  0  . mometasone (ASMANEX 60 METERED DOSES) 220 MCG/INH inhaler Inhale 2 puffs into the lungs daily.  1 Inhaler  0   No current facility-administered medications for this visit.     Allergies  Allergen Reactions  . Dust Mite Extract Other (See Comments)    Teary eyed  . Other     Cats   . Pollen Extract   :  Family History  Problem Relation Age of Onset  . Colon cancer Maternal Grandfather     colon cancer  . Hypertension Maternal Grandmother   . Depression Maternal Grandmother   . Cancer Paternal Grandfather   . Other Neg Hx   . Hypertension Maternal Grandmother   :  History   Social History  . Marital Status: Married    Spouse Name: N/A    Number of Children: N/A  . Years of Education: N/A   Occupational History  . Not on file.   Social History Main Topics  . Smoking status: Never Smoker   . Smokeless tobacco: Never Used  . Alcohol Use: No  . Drug Use: No  . Sexual Activity: Yes    Birth Control/ Protection: None   Other Topics Concern  . Not on file   Social History Narrative  . No narrative on file  :  A comprehensive review of systems was negative.  Exam: Blood  pressure 115/76, pulse 90, temperature 98.3 F (36.8 C), temperature source Oral, resp. rate 18, height 5\' 2"  (1.575 m), weight 98 lb 4.8 oz (44.589 kg). General appearance: alert, cooperative and appears stated age Head: Normocephalic, without obvious abnormality, atraumatic Eyes: conjunctivae/corneas clear. PERRL, EOM's intact. Fundi benign. Throat: lips, mucosa, and tongue normal; teeth and gums normal Neck: no adenopathy, no carotid bruit, no JVD, supple, symmetrical, trachea midline and thyroid not enlarged, symmetric, no tenderness/mass/nodules Back: symmetric, no curvature. ROM normal. No CVA tenderness. Resp: clear to auscultation bilaterally Chest wall: no tenderness Cardio: regular  rate and rhythm, S1, S2 normal, no murmur, click, rub or gallop GI: soft, non-tender; bowel sounds normal; no masses,  no organomegaly Extremities: extremities normal, atraumatic, no cyanosis or edema Skin: ecchymoses - lower leg(s) bilateral   Recent Labs  04/27/13 1056  WBC 4.3  HGB 13.3  HCT 39.9  PLT 231     Assessment and Plan:   32 year old woman with spontaneous ecchymoses without, an elevated PTT with a normal PT. She did not have any strong personal history of heavy bleeding but she did develop a hematoma after the vaginal delivery of her first child about 6 years ago. She did give birth about a year ago without any complications. She does not endorse heavy menstrual cycles or any post operative bleeding. The differential diagnosis was discussed today which include postpartum coagulopathy versus an inherited bleeding disorder the most common of which would be von Willebrand's disease. Certainly hemophilia is extremely rare in females and is on likely. Rare factor deficiencies such as factor XI would be a consideration but also unlikely. I am checking factor levels today as well as a von Willebrand's panel and I will have more information after these results.  Clinically, she does not have signs of symptoms of inherited bleeding disorder and at this time no further management is needed but I will continue to follow her clinically and have her come back in 3 months for another visit. In the meantime ultimately keep the results of her laboratory testing as soon as I have the results.  All her questions are answered today.

## 2013-07-15 ENCOUNTER — Telehealth: Payer: Self-pay | Admitting: Pulmonary Disease

## 2013-07-15 ENCOUNTER — Other Ambulatory Visit: Payer: Self-pay

## 2013-07-15 MED ORDER — AZITHROMYCIN 250 MG PO TABS: 250.0000 mg | ORAL_TABLET | Freq: Once | ORAL | Status: DC

## 2013-07-15 MED ORDER — BECLOMETHASONE DIPROPIONATE 40 MCG/ACT IN AERS: 1.0000 | INHALATION_SPRAY | Freq: Every day | RESPIRATORY_TRACT | Status: DC

## 2013-07-15 NOTE — Telephone Encounter (Signed)
meds called [facility] [address] Pt aware.

## 2013-07-15 NOTE — Telephone Encounter (Signed)
Pt c/o increased cough and mucus production clear-light yellow.  Requesting rxs recommended at July appt--pt refused them at that time d/t breast feeding.  Patient Instructions  03/2013    Sputum sample for culture  Trial of Z-pak x 5days (antibiotic)  Trial of Qvar inhaler 40 once daily -RINSE mouth after  Call back in 2 weeks to report  Can take DELSYM 2 tsp twice daily as needed for cough  Hematology referral   [facility] [address]  Please advise Dr Vassie Loll is okay to fill these meds. Thanks.

## 2013-07-15 NOTE — Telephone Encounter (Signed)
Per RA okay refill these medications. thanks

## 2013-07-28 ENCOUNTER — Other Ambulatory Visit (HOSPITAL_BASED_OUTPATIENT_CLINIC_OR_DEPARTMENT_OTHER): Payer: BC Managed Care – PPO | Admitting: Lab

## 2013-07-28 ENCOUNTER — Ambulatory Visit (HOSPITAL_BASED_OUTPATIENT_CLINIC_OR_DEPARTMENT_OTHER): Payer: BC Managed Care – PPO | Admitting: Oncology

## 2013-07-28 VITALS — BP 110/70 | HR 81 | Temp 97.6°F | Resp 18 | Ht 62.0 in | Wt 104.3 lb

## 2013-07-28 DIAGNOSIS — R233 Spontaneous ecchymoses: Secondary | ICD-10-CM

## 2013-07-28 LAB — CBC WITH DIFFERENTIAL/PLATELET
Basophils Absolute: 0 10*3/uL (ref 0.0–0.1)
EOS%: 2.1 % (ref 0.0–7.0)
HCT: 40 % (ref 34.8–46.6)
HGB: 13 g/dL (ref 11.6–15.9)
MCH: 30.9 pg (ref 25.1–34.0)
MONO#: 0.4 10*3/uL (ref 0.1–0.9)
NEUT#: 3.8 10*3/uL (ref 1.5–6.5)
RDW: 13 % (ref 11.2–14.5)
WBC: 5.7 10*3/uL (ref 3.9–10.3)
lymph#: 1.4 10*3/uL (ref 0.9–3.3)

## 2013-07-28 NOTE — Progress Notes (Signed)
Hematology and Oncology Follow Up Visit  Michelle Hayes [id number] Jul 05, 1981 32 y.o. 07/28/2013 8:55 AM Purcell Nails, MDRoberts, Woodroe Mode, MD   Principle Diagnosis: 32 year old woman with possible coagulopathy and ecchymosis. Her workup has been unrevealing for any bleeding disorder and her coagulation parameters corrected.   Interim History: 32 year old woman time that is seen today for a followup visit after her initial consultation in August of 2014. She had presented with a chemosis an elevated PTT that a repeat laboratory testing included a von Willebrand's testing as well as repeat coagulation parameters all were within normal range. Since her last visit, she is completely asymptomatic at this point. She has not reported any bleeding or bruising issues. She has not reported any epistaxis or hematochezia did not report any melena. That report any recent hospitalization or illnesses.  Medications: I have reviewed the patient's current medications.  Current Outpatient Prescriptions  Medication Sig Dispense Refill  . Prenat w/o A-FE-DSS-Methfol-FA (PRENATAL MULTIVITAMIN) 90-600-400 MG-MCG-MCG tablet Take 1 tablet by mouth daily.         No current facility-administered medications for this visit.     Allergies:  Allergies  Allergen Reactions  . Dust Mite Extract Other (See Comments)    Teary eyed  . Other     Cats   . Pollen Extract     Past Medical History, Surgical history, Social history, and Family History were reviewed and updated.  Review of Systems:  Remaining ROS negative. Physical Exam: Blood pressure 110/70, pulse 81, temperature 97.6 F (36.4 C), temperature source Oral, resp. rate 18, height 5\' 2"  (1.575 m), weight 104 lb 4.8 oz (47.31 kg). ECOG: 0 General appearance: alert Head: Normocephalic, without obvious abnormality, atraumatic Neck: no adenopathy, no carotid bruit, no JVD, supple, symmetrical, trachea midline and thyroid not enlarged, symmetric, no  tenderness/mass/nodules Lymph nodes: Cervical, supraclavicular, and axillary nodes normal. Heart:regular rate and rhythm, S1, S2 normal, no murmur, click, rub or gallop Lung:chest clear, no wheezing, rales, normal symmetric air entry Abdomin: soft, non-tender, without masses or organomegaly EXT:no erythema, induration, or nodules   Lab Results: Lab Results  Component Value Date   WBC 5.7 07/28/2013   HGB 13.0 07/28/2013   HCT 40.0 07/28/2013   MCV 94.7 07/28/2013   PLT 198 07/28/2013     Chemistry      Component Value Date/Time   NA 141 04/27/2013 1057   NA 140 02/17/2013 1039   K 4.0 04/27/2013 1057   K 3.8 02/17/2013 1039   CL 104 02/17/2013 1039   CO2 25 04/27/2013 1057   CO2 25 02/17/2013 1039   BUN 12.7 04/27/2013 1057   BUN 10 02/17/2013 1039   CREATININE 0.8 04/27/2013 1057   CREATININE 0.7 02/17/2013 1039      Component Value Date/Time   CALCIUM 9.2 04/27/2013 1057   CALCIUM 10.2 02/17/2013 1039   ALKPHOS 74 04/27/2013 1057   ALKPHOS 74 02/17/2013 1039   AST 22 04/27/2013 1057   AST 31 02/17/2013 1039   ALT 24 04/27/2013 1057   ALT 25 02/17/2013 1039   BILITOT 0.97 04/27/2013 1057   BILITOT 1.1 02/17/2013 1039      Impression and Plan:  32 year old woman with spontaneous ecchymosis and an elevated PTT that now have resolved. It is unclear exactly what caused this but on my repeat testing on 2 separate occasions revealed normal coagulation parameters as well as a normal von Willebrand's screen. At this time, clinically she has no reason to suspect an inherited bleeding  disorder it is very possible that she might have an acquired bleeding disorder now have resolved. I see no reason for further testing and I'll be happy to see her in the future should this problem reoccurs.  Dontrey Snellgrove, MD 11/19/20148:55 AM

## 2014-01-04 IMAGING — CR DG CHEST 2V
2 series · 2 of 2 positions shown · non-contrast
Comparison: None.

CLINICAL DATA: Fall, shortness of breath and chest pain.

CHEST - 2 VIEW

[view not recorded (1 of 2)]
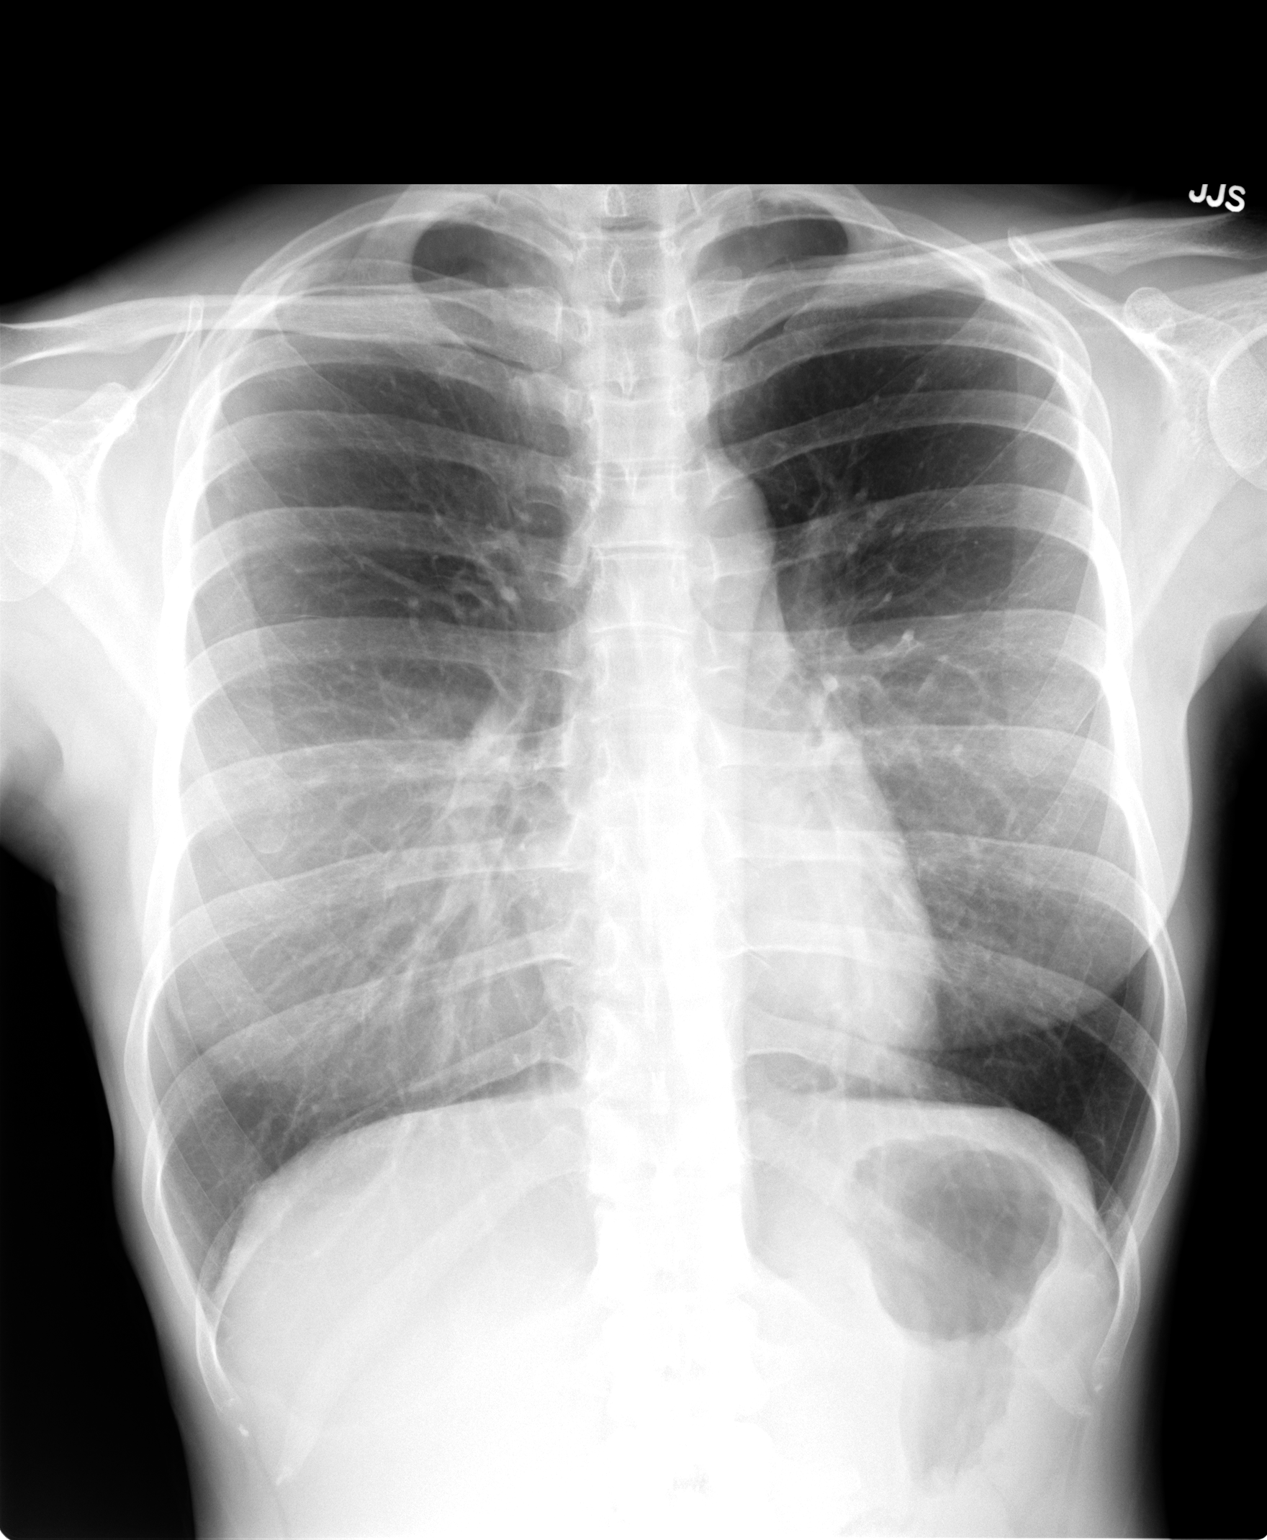

[view not recorded (2 of 2)]
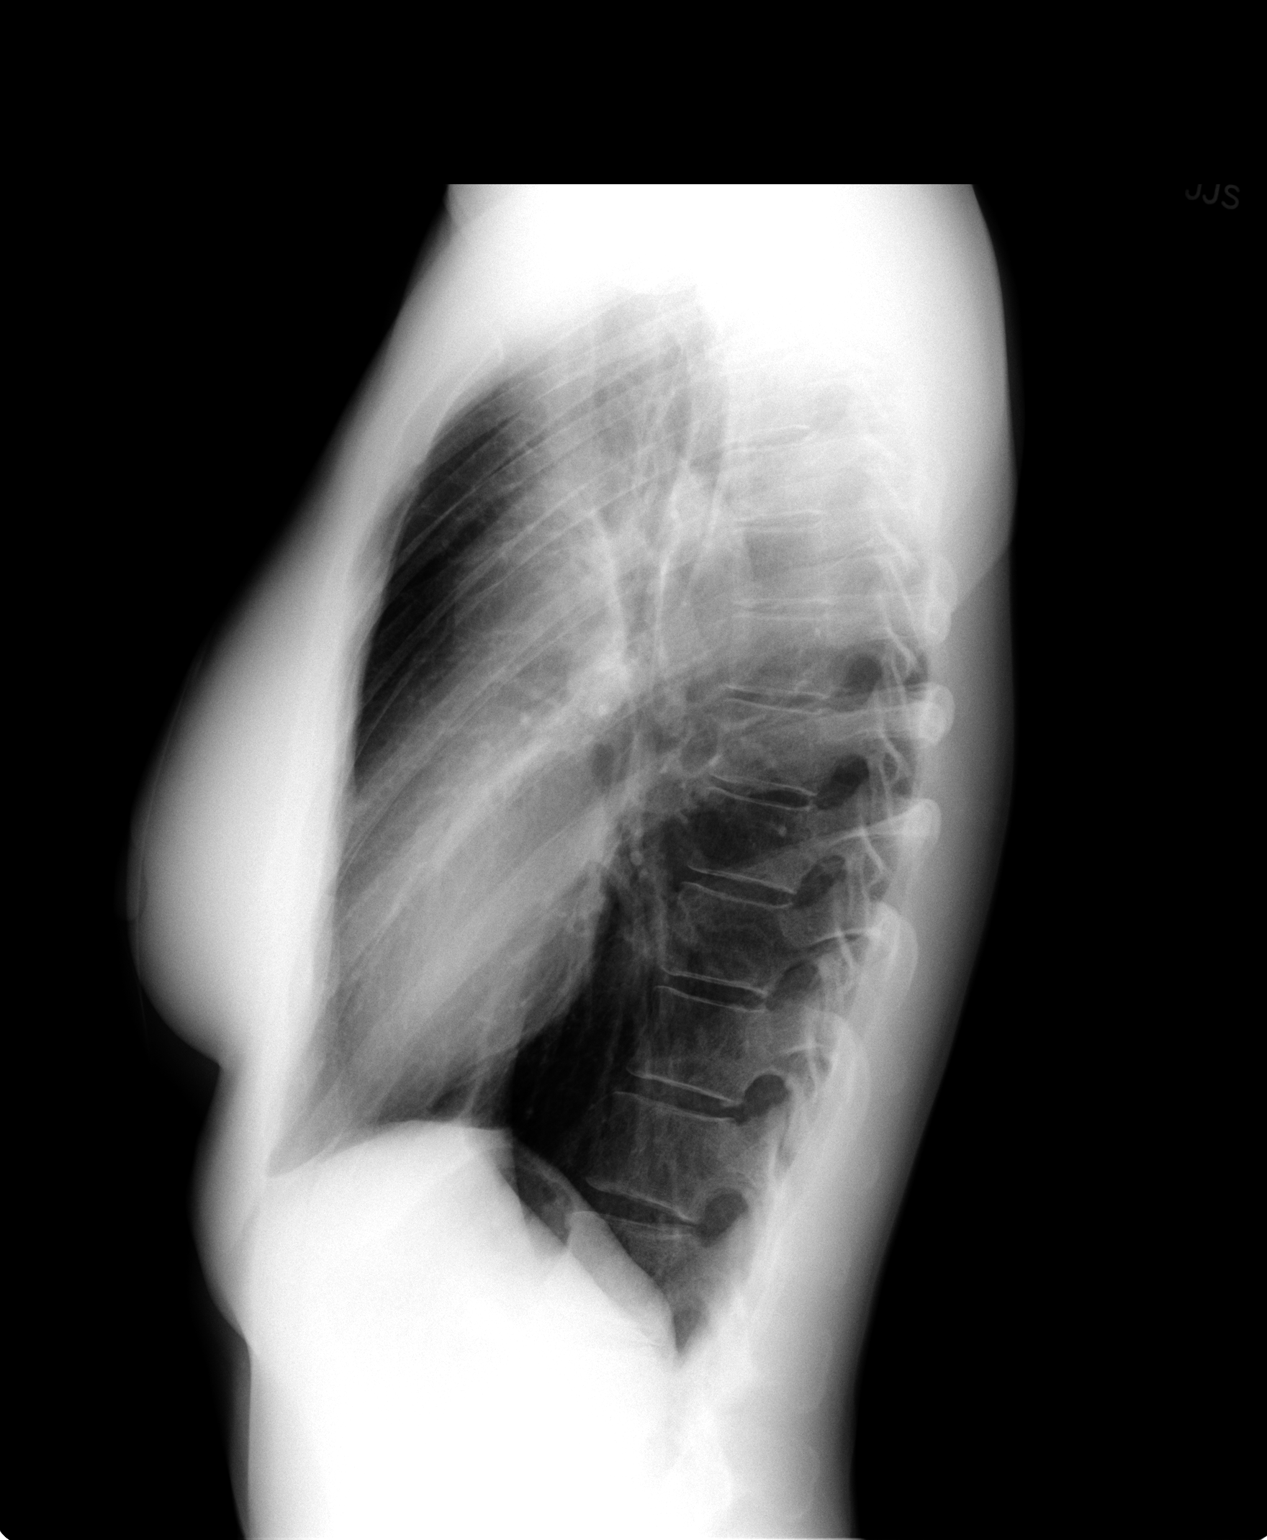

[2 of 2 positions shown; findings below may reference images not displayed]

FINDINGS: Lungs are clear.  Heart size is normal.  No pneumothorax
or pleural fluid.
IMPRESSION: Normal chest.

## 2014-07-11 ENCOUNTER — Encounter: Payer: Self-pay | Admitting: Pulmonary Disease

## 2015-03-15 ENCOUNTER — Telehealth: Payer: Self-pay | Admitting: Cardiology

## 2015-03-15 NOTE — Telephone Encounter (Signed)
[facility] appointment on 04/05/15 with Dr [facility].  [facility] for Dr Ludwig Clarksrenshaw's schedule on 04/05/15.  lp

## 2015-04-05 ENCOUNTER — Ambulatory Visit (INDEPENDENT_AMBULATORY_CARE_PROVIDER_SITE_OTHER): Payer: BLUE CROSS/BLUE SHIELD | Admitting: Cardiology

## 2015-04-05 ENCOUNTER — Encounter: Payer: Self-pay | Admitting: *Deleted

## 2015-04-05 ENCOUNTER — Encounter: Payer: Self-pay | Admitting: Cardiology

## 2015-04-05 VITALS — BP 117/75 | HR 90 | Ht 62.0 in | Wt 105.1 lb

## 2015-04-05 DIAGNOSIS — R0600 Dyspnea, unspecified: Secondary | ICD-10-CM | POA: Diagnosis not present

## 2015-04-05 DIAGNOSIS — R072 Precordial pain: Secondary | ICD-10-CM | POA: Diagnosis not present

## 2015-04-05 DIAGNOSIS — R079 Chest pain, unspecified: Secondary | ICD-10-CM | POA: Insufficient documentation

## 2015-04-05 DIAGNOSIS — R002 Palpitations: Secondary | ICD-10-CM

## 2015-04-05 NOTE — Assessment & Plan Note (Signed)
Patient has palpitations on a daily basis. I will arrange a 48 hour Holter monitor to further assess.

## 2015-04-05 NOTE — Progress Notes (Signed)
     HPI: 34 year old female for evaluation of palpitations. Patient states she had a syncopal episode in high school. She was evaluated and had a monitor that showed "irregular heartbeat". However no other treatment felt necessary. She now states that she will occasionally feels her heart "pounding". It lasts 2-3 minutes and resolves. No associated symptoms. Occurs both with exertion and at rest. Also notes dyspnea on exertion but no orthopnea, PND or pedal edema. She has not had syncope. She has had pain in the left chest followed by the left upper quadrant followed by the substernal area. It is a mild pressure lasting 30 minutes and resolving spontaneously. Not pleuritic or positional.  No current outpatient prescriptions on file.   No current facility-administered medications for this visit.    Allergies  Allergen Reactions  . Dust Mite Extract Other (See Comments)    Teary eyed  . Other     Cats   . Pollen Extract      Past Medical History  Diagnosis Date  . Heart murmur   . Anemia   . Anxiety 2010    panic attacks  . Anemia   . Irregular heart beat   . H/O rubella   . H/O varicella   . H/O hepatitis     Past Surgical History  Procedure Laterality Date  . No past surgeries      no surgical History    History   Social History  . Marital Status: Married    Spouse Name: N/A  . Number of Children: 2  . Years of Education: N/A   Occupational History  . Not on file.   Social History Main Topics  . Smoking status: Never Smoker   . Smokeless tobacco: Never Used  . Alcohol Use: 0.6 oz/week    1 Glasses of wine per week     Comment: Occasional  . Drug Use: No  . Sexual Activity: Yes    Birth Control/ Protection: None   Other Topics Concern  . Not on file   Social History Narrative    Family History  Problem Relation Age of Onset  . Colon cancer Maternal Grandfather     colon cancer  . Depression Maternal Grandmother   . Cancer Paternal Grandfather     . Colon polyps Mother   . Hypertension Maternal Grandmother   . Hyperlipidemia Father     ROS: no fevers or chills, productive cough, hemoptysis, dysphasia, odynophagia, melena, hematochezia, dysuria, hematuria, rash, seizure activity, orthopnea, PND, pedal edema, claudication. Remaining systems are negative.  Physical Exam:   Blood pressure 117/75, pulse 90, height  (1.575 m), weight 105 lb 1.9 oz (47.682 kg).  General:  Well developed/well nourished in NAD Skin warm/dry Patient not depressed No peripheral clubbing Back-normal HEENT-normal/normal eyelids Neck supple/normal carotid upstroke bilaterally; no bruits; no JVD; no thyromegaly chest - CTA/ normal expansion CV - RRR/normal S1 and S2; no murmurs, rubs or gallops;  PMI nondisplaced Abdomen -NT/ND, no HSM, no mass, + bowel sounds, no bruit 2+ femoral pulses, no bruits Ext-no edema, chords, 2+ DP Neuro-grossly nonfocal  ECG sinus rhythm at a rate of 90. Normal axis. RV conduction delay.

## 2015-04-05 NOTE — Assessment & Plan Note (Signed)
Schedule echocardiogram to assess LV function. 

## 2015-04-05 NOTE — Assessment & Plan Note (Signed)
Symptoms are vague and atypical. I do not think likely related to coronary disease.

## 2015-04-05 NOTE — Patient Instructions (Addendum)
Your physician recommends that you schedule a follow-up appointment in: 8 WEEKS WITH DR  Jens Som  Your physician has requested that you have an echocardiogram. Echocardiography is a painless test that uses sound waves to create images of your heart. It provides your doctor with information about the size and shape of your heart and how well your heart's chambers and valves are working. This procedure takes approximately one hour. There are no restrictions for this procedure.   Your physician has recommended that you wear a holter monitor. Holter monitors are medical devices that record the heart's electrical activity. Doctors most often use these monitors to diagnose arrhythmias. Arrhythmias are problems with the speed or rhythm of the heartbeat. The monitor is a small, portable device. You can wear one while you do your normal daily activities. This is usually used to diagnose what is causing palpitations/syncope (passing out).

## 2015-04-05 NOTE — Addendum Note (Signed)
Addended by: Freddi Starr on: 04/05/2015 09:25 AM   Modules accepted: Orders

## 2015-04-18 ENCOUNTER — Other Ambulatory Visit: Payer: Self-pay

## 2015-04-18 ENCOUNTER — Ambulatory Visit (HOSPITAL_COMMUNITY): Payer: BLUE CROSS/BLUE SHIELD | Attending: Cardiovascular Disease

## 2015-04-18 ENCOUNTER — Ambulatory Visit (INDEPENDENT_AMBULATORY_CARE_PROVIDER_SITE_OTHER): Payer: BLUE CROSS/BLUE SHIELD

## 2015-04-18 DIAGNOSIS — R002 Palpitations: Secondary | ICD-10-CM | POA: Insufficient documentation

## 2015-04-24 ENCOUNTER — Telehealth: Payer: Self-pay | Admitting: *Deleted

## 2015-04-24 NOTE — Telephone Encounter (Signed)
pt aware of results  

## 2015-04-24 NOTE — Telephone Encounter (Signed)
Monitor reviewed by dr Jens Som shows  Sinus with rare pac and pvc         Left message for pt to call

## 2015-05-31 ENCOUNTER — Ambulatory Visit: Payer: BLUE CROSS/BLUE SHIELD | Admitting: Cardiology

## 2016-07-03 DIAGNOSIS — R634 Abnormal weight loss: Secondary | ICD-10-CM | POA: Diagnosis not present

## 2016-07-03 DIAGNOSIS — Z01419 Encounter for gynecological examination (general) (routine) without abnormal findings: Secondary | ICD-10-CM | POA: Diagnosis not present

## 2016-07-03 DIAGNOSIS — Z124 Encounter for screening for malignant neoplasm of cervix: Secondary | ICD-10-CM | POA: Diagnosis not present

## 2016-07-03 DIAGNOSIS — Z681 Body mass index (BMI) 19 or less, adult: Secondary | ICD-10-CM | POA: Diagnosis not present

## 2016-11-27 DIAGNOSIS — Z0000 Encounter for general adult medical examination without abnormal findings: Secondary | ICD-10-CM | POA: Diagnosis not present

## 2016-11-27 DIAGNOSIS — Z800 Family history of malignant neoplasm of digestive organs: Secondary | ICD-10-CM | POA: Diagnosis not present

## 2017-01-14 DIAGNOSIS — J069 Acute upper respiratory infection, unspecified: Secondary | ICD-10-CM | POA: Diagnosis not present

## 2017-06-20 DIAGNOSIS — Z7289 Other problems related to lifestyle: Secondary | ICD-10-CM | POA: Diagnosis not present

## 2017-06-20 DIAGNOSIS — J384 Edema of larynx: Secondary | ICD-10-CM | POA: Diagnosis not present

## 2017-06-20 DIAGNOSIS — J343 Hypertrophy of nasal turbinates: Secondary | ICD-10-CM | POA: Diagnosis not present

## 2017-06-20 DIAGNOSIS — R490 Dysphonia: Secondary | ICD-10-CM | POA: Diagnosis not present

## 2017-07-18 ENCOUNTER — Other Ambulatory Visit: Payer: Self-pay | Admitting: Obstetrics and Gynecology

## 2017-07-18 DIAGNOSIS — Z01419 Encounter for gynecological examination (general) (routine) without abnormal findings: Secondary | ICD-10-CM | POA: Diagnosis not present

## 2017-07-18 DIAGNOSIS — Z681 Body mass index (BMI) 19 or less, adult: Secondary | ICD-10-CM | POA: Diagnosis not present

## 2017-07-18 DIAGNOSIS — N841 Polyp of cervix uteri: Secondary | ICD-10-CM | POA: Diagnosis not present

## 2017-07-18 DIAGNOSIS — Z3040 Encounter for surveillance of contraceptives, unspecified: Secondary | ICD-10-CM | POA: Diagnosis not present

## 2017-07-18 DIAGNOSIS — N920 Excessive and frequent menstruation with regular cycle: Secondary | ICD-10-CM | POA: Diagnosis not present

## 2017-08-27 DIAGNOSIS — N920 Excessive and frequent menstruation with regular cycle: Secondary | ICD-10-CM | POA: Diagnosis not present

## 2017-11-07 DIAGNOSIS — H04123 Dry eye syndrome of bilateral lacrimal glands: Secondary | ICD-10-CM | POA: Diagnosis not present

## 2017-11-07 DIAGNOSIS — H01022 Squamous blepharitis right lower eyelid: Secondary | ICD-10-CM | POA: Diagnosis not present

## 2017-11-07 DIAGNOSIS — H01021 Squamous blepharitis right upper eyelid: Secondary | ICD-10-CM | POA: Diagnosis not present

## 2017-11-07 DIAGNOSIS — H40013 Open angle with borderline findings, low risk, bilateral: Secondary | ICD-10-CM | POA: Diagnosis not present

## 2017-12-18 DIAGNOSIS — H40013 Open angle with borderline findings, low risk, bilateral: Secondary | ICD-10-CM | POA: Diagnosis not present

## 2017-12-18 DIAGNOSIS — H01021 Squamous blepharitis right upper eyelid: Secondary | ICD-10-CM | POA: Diagnosis not present

## 2018-02-16 DIAGNOSIS — Z23 Encounter for immunization: Secondary | ICD-10-CM | POA: Diagnosis not present

## 2018-02-16 DIAGNOSIS — Z1389 Encounter for screening for other disorder: Secondary | ICD-10-CM | POA: Diagnosis not present

## 2018-02-16 DIAGNOSIS — Z0000 Encounter for general adult medical examination without abnormal findings: Secondary | ICD-10-CM | POA: Diagnosis not present

## 2018-08-25 DIAGNOSIS — Z681 Body mass index (BMI) 19 or less, adult: Secondary | ICD-10-CM | POA: Diagnosis not present

## 2018-08-25 DIAGNOSIS — N920 Excessive and frequent menstruation with regular cycle: Secondary | ICD-10-CM | POA: Diagnosis not present

## 2018-08-25 DIAGNOSIS — Z3040 Encounter for surveillance of contraceptives, unspecified: Secondary | ICD-10-CM | POA: Diagnosis not present

## 2018-08-25 DIAGNOSIS — Z01419 Encounter for gynecological examination (general) (routine) without abnormal findings: Secondary | ICD-10-CM | POA: Diagnosis not present

## 2019-03-15 ENCOUNTER — Encounter: Payer: Self-pay | Admitting: Internal Medicine

## 2020-03-01 ENCOUNTER — Encounter: Payer: Self-pay | Admitting: Internal Medicine

## 2020-03-01 ENCOUNTER — Other Ambulatory Visit: Payer: Self-pay

## 2020-03-01 ENCOUNTER — Ambulatory Visit (INDEPENDENT_AMBULATORY_CARE_PROVIDER_SITE_OTHER): Payer: BC Managed Care – PPO | Admitting: Internal Medicine

## 2020-03-01 VITALS — Temp 98.2°F | Ht 62.0 in | Wt 103.6 lb

## 2020-03-01 DIAGNOSIS — R42 Dizziness and giddiness: Secondary | ICD-10-CM | POA: Diagnosis not present

## 2020-03-01 DIAGNOSIS — Z1159 Encounter for screening for other viral diseases: Secondary | ICD-10-CM | POA: Diagnosis not present

## 2020-03-01 DIAGNOSIS — R002 Palpitations: Secondary | ICD-10-CM

## 2020-03-01 DIAGNOSIS — E559 Vitamin D deficiency, unspecified: Secondary | ICD-10-CM | POA: Diagnosis not present

## 2020-03-01 DIAGNOSIS — L509 Urticaria, unspecified: Secondary | ICD-10-CM

## 2020-03-01 NOTE — Progress Notes (Signed)
This visit occurred during the SARS-CoV-2 public health emergency.  Safety protocols were in place, including screening questions prior to the visit, additional usage of staff PPE, and extensive cleaning of exam room while observing appropriate contact time as indicated for disinfecting solutions.  Subjective:     Patient ID: Michelle Hayes , female    DOB: [birth year] , 39 y.o.   MRN: [id number]   Chief Complaint  Patient presents with  . Dizziness    HPI  She is here today for further evaluation of dizziness. Her sx started a month ago. She reports her sx occur when walking or sitting.  She reports drinking 64 ounces of water per day. Associated with palpitations. She reports having irregular heart beat. She reports drinking plenty of water daily. She adds that she sometimes breaks out in a rash. It is red and itchy. She admits she is under a lot of stress.   Dizziness This is a recurrent problem. The current episode started more than 1 month ago. The problem occurs 2 to 4 times per day. The problem has been unchanged. Pertinent negatives include no chest pain, myalgias, nausea or neck pain. The symptoms are aggravated by walking. She has tried nothing for the symptoms.     Past Medical History:  Diagnosis Date  . Anemia   . Anemia   . Anxiety 2010   panic attacks  . H/O hepatitis   . H/O rubella   . H/O varicella   . Heart murmur   . Irregular heart beat      Family History  Problem Relation Age of Onset  . Colon cancer Maternal Grandfather        colon cancer  . Colon polyps Mother   . Hyperlipidemia Father   . Depression Maternal Grandmother   . Hypertension Maternal Grandmother   . Cancer Paternal Grandfather      Current Outpatient Medications:  .  Cholecalciferol (VITAMIN D) 50 MCG (2000 UT) tablet, , Disp: , Rfl:  .  Ferrous Gluconate-C-Folic Acid (IRON-C PO), , Disp: , Rfl:    Allergies  Allergen Reactions  . Dust Mite Extract Other (See Comments)    Teary  eyed  . Other     Cats   . Pollen Extract      Review of Systems  Constitutional: Negative.   Respiratory: Negative.   Cardiovascular: Negative.  Negative for chest pain.  Gastrointestinal: Negative.  Negative for nausea.  Musculoskeletal: Negative for myalgias and neck pain.  Skin:       She c/o hives. She sees them upon awakening. Not precipitated by exercise. Not worsened by hot showers.   Neurological: Positive for dizziness.  Psychiatric/Behavioral: Negative.      Today's Vitals   03/01/20 1102  Temp: 98.2 F (36.8 C)  TempSrc: Oral  Weight: 103 lb 9.6 oz (47 kg)  Height: 5' 2" (1.575 m)   Body mass index is 18.95 kg/m.   Objective:  Physical Exam Vitals and nursing note reviewed.  Constitutional:      Appearance: Normal appearance.  HENT:     Head: Normocephalic and atraumatic.  Cardiovascular:     Rate and Rhythm: Normal rate and regular rhythm.     Heart sounds: Normal heart sounds.  Pulmonary:     Effort: Pulmonary effort is normal.     Breath sounds: Normal breath sounds.  Skin:    General: Skin is warm.     Comments: Urticaria on left leg, medial to her knee  Neurological:     General: No focal deficit present.     Mental Status: She is alert.  Psychiatric:        Mood and Affect: Mood normal.        Behavior: Behavior normal.         Assessment And Plan:     1. Dizziness  EKG performed, NSR w/o acute changes.  I will check labs as listed below. I will make further recommendations once her labs are avail for review. She is also encouraged to stay well hydrated.   - CMP14+EGFR - EKG 12-Lead  2. Hives  Recurrent. I will check CBC w/ diff. She is advised to take Benadryl should sx recur.  - CBC with Diff  3. Palpitations  Recurrent. EKG without arrhythmia. She agrees to Cardiology evaluation. She is advised to take magnesium 439m nightly. She is aware she can get this over the counter.   4. Vitamin D deficiency  I WILL CHECK A VIT D  LEVEL AND SUPPLEMENT AS NEEDED.  ALSO ENCOURAGED TO SPEND 15 MINUTES IN THE SUN DAILY.  - Vitamin D (25 hydroxy)  5. Encounter for HCV screening test for low risk patient  - Hepatitis C antibody   RMaximino Greenland MD    THE PATIENT IS ENCOURAGED TO PRACTICE SOCIAL DISTANCING DUE TO THE COVID-19 PANDEMIC.

## 2020-03-01 NOTE — Patient Instructions (Signed)

## 2020-03-02 ENCOUNTER — Encounter: Payer: Self-pay | Admitting: Internal Medicine

## 2020-03-02 ENCOUNTER — Other Ambulatory Visit: Payer: Self-pay

## 2020-03-02 LAB — CBC WITH DIFFERENTIAL/PLATELET
Basophils Absolute: 0 10*3/uL (ref 0.0–0.2)
Basos: 0 %
EOS (ABSOLUTE): 0 10*3/uL (ref 0.0–0.4)
Eos: 0 %
Hematocrit: 43 % (ref 34.0–46.6)
Hemoglobin: 14.2 g/dL (ref 11.1–15.9)
Immature Grans (Abs): 0 10*3/uL (ref 0.0–0.1)
Immature Granulocytes: 0 %
Lymphocytes Absolute: 1.5 10*3/uL (ref 0.7–3.1)
Lymphs: 28 %
MCH: 32.9 pg (ref 26.6–33.0)
MCHC: 33 g/dL (ref 31.5–35.7)
MCV: 100 fL — ABNORMAL HIGH (ref 79–97)
Monocytes Absolute: 0.5 10*3/uL (ref 0.1–0.9)
Monocytes: 9 %
Neutrophils Absolute: 3.3 10*3/uL (ref 1.4–7.0)
Neutrophils: 63 %
Platelets: 182 10*3/uL (ref 150–450)
RBC: 4.31 x10E6/uL (ref 3.77–5.28)
RDW: 12.6 % (ref 11.7–15.4)
WBC: 5.2 10*3/uL (ref 3.4–10.8)

## 2020-03-02 LAB — CMP14+EGFR
ALT: 7 IU/L (ref 0–32)
AST: 18 IU/L (ref 0–40)
Albumin/Globulin Ratio: 1.6 (ref 1.2–2.2)
Albumin: 4.7 g/dL (ref 3.8–4.8)
Alkaline Phosphatase: 60 IU/L (ref 48–121)
BUN/Creatinine Ratio: 11 (ref 9–23)
BUN: 8 mg/dL (ref 6–24)
Bilirubin Total: 0.7 mg/dL (ref 0.0–1.2)
CO2: 22 mmol/L (ref 20–29)
Calcium: 9.1 mg/dL (ref 8.7–10.2)
Chloride: 104 mmol/L (ref 96–106)
Creatinine, Ser: 0.75 mg/dL (ref 0.57–1.00)
GFR calc Af Amer: 114 mL/min/{1.73_m2} (ref >59)
GFR calc non Af Amer: 99 mL/min/{1.73_m2} (ref >59)
Globulin, Total: 2.9 g/dL (ref 1.5–4.5)
Glucose: 86 mg/dL (ref 65–99)
Potassium: 3.9 mmol/L (ref 3.5–5.2)
Sodium: 139 mmol/L (ref 134–144)
Total Protein: 7.6 g/dL (ref 6.0–8.5)

## 2020-03-02 LAB — VITAMIN D 25 HYDROXY (VIT D DEFICIENCY, FRACTURES): Vit D, 25-Hydroxy: 23 ng/mL — ABNORMAL LOW (ref 30.0–100.0)

## 2020-03-02 LAB — HEPATITIS C ANTIBODY: Hep C Virus Ab: <0.1 s/co ratio (ref 0.0–0.9)

## 2020-03-27 DIAGNOSIS — L509 Urticaria, unspecified: Secondary | ICD-10-CM | POA: Diagnosis not present

## 2020-03-27 DIAGNOSIS — L503 Dermatographic urticaria: Secondary | ICD-10-CM | POA: Diagnosis not present

## 2020-03-30 ENCOUNTER — Other Ambulatory Visit: Payer: Self-pay

## 2020-03-30 ENCOUNTER — Ambulatory Visit (INDEPENDENT_AMBULATORY_CARE_PROVIDER_SITE_OTHER): Payer: BC Managed Care – PPO | Admitting: Cardiology

## 2020-03-30 ENCOUNTER — Encounter: Payer: Self-pay | Admitting: Cardiology

## 2020-03-30 VITALS — BP 104/72 | HR 84 | Ht 63.0 in | Wt 103.0 lb

## 2020-03-30 DIAGNOSIS — Z7189 Other specified counseling: Secondary | ICD-10-CM

## 2020-03-30 DIAGNOSIS — R002 Palpitations: Secondary | ICD-10-CM

## 2020-03-30 NOTE — Patient Instructions (Addendum)
Other instruction: If  your heartbeats start back to being fast/hard, and if it is frequent, please call us and we will send you a two week monitor. Otherwise, the option is for a [employer] device, which you keep, and you can send me the strips through mychart.  Medication Instructions:  No changes  *If you need a refill on your cardiac medications before your next appointment, please call your pharmacy*   Lab Work: Not needed   Testing/Procedures: Not needed   Follow-Up: At [facility], you and your health needs are our priority.  As part of our continuing mission to provide you with exceptional heart care, we have created designated Provider Care Teams.  These Care Teams include your primary Cardiologist (physician) and Advanced Practice Providers (APPs -  Physician Assistants and Nurse Practitioners) who all work together to provide you with the care you need, when you need it.     Your next appointment:    as needed  The format for your next appointment:   in person or virtual   Provider:   You may see Jodelle Red, MD or one of the following Advanced Practice Providers on your designated Care Team:    Theodore Demark, PA-C  Joni Reining, DNP, ANP  Cadence Fransico Michael, PA-C

## 2020-03-30 NOTE — Progress Notes (Signed)
Cardiology Office Note:    Date:  03/30/2020   ID:  Michelle Hayes, DOB Jan 12, 1981, MRN [id number]  PCP:  Dorothyann Peng, MD  Cardiologist:  Jodelle Red, MD  Referring MD: Dorothyann Peng, MD   CC: new patient consultation for palpitations  History of Present Illness:    Michelle Hayes is a 39 y.o. female without significant prior cardiac history who is seen as a new consult at the request of Dorothyann Peng, MD for the evaluation and management of palpitations. She was previously evaluated for this in 2016 by Dr. Jens Som.  Visit note from Dr. Allyne Gee dated 03/01/20 reviewed. Endorsed one month history of dizziness, palpitations, rash. Episodes occurring several times/day. Referred to cardiology for further evaluation. ECG NSR.  Tachycardia/palpitations: -Initial onset: had first panic attack about 10 years ago. She is wondering if her symptoms are her heart or anxiety.  -Frequency/Duration: started about 6 weeks ago, feeling dizzy, heart being fast/pounding. Happened several times/day -Associated symptoms: dizziness, occasional cough, occasional rash/hives -Aggravating/alleviating factors: better with stopping alcohol -Syncope/near syncope: did occur in high school, but none recently. Was told her heart beat was irregular. -Prior cardiac history: palpitations, as above -Prior ECG: NSR -Prior workup: holter, echo 2016 (below) unremarkable -Prior treatment: none -Possible medication interactions: none -Caffeine: 1 coffee/day, occasional tea -Alcohol: worsened when she was drinking 1 drink/day. Stopped completely a month ago, no longer has dizziness or sensation of irregular heart beats. -Tobacco: never -OTC supplements: all reviewed -Comorbidities: none -Exercise level: walks daily, does yoga routinely. -Labs: TSH, kidney function/electrolytes, CBC reviewed. -Cardiac ROS: no chest pain, no shortness of breath, no PND, no orthopnea, no LE edema. -Family history: mother had  mild unclear heart issue  No longer having hives on antihistamine. Has questions re: Covid vaccine.  Past Medical History:  Diagnosis Date  . Anemia   . Anemia   . Anxiety 2010   panic attacks  . H/O hepatitis   . H/O rubella   . H/O varicella   . Heart murmur   . Irregular heart beat     Past Surgical History:  Procedure Laterality Date  . NO PAST SURGERIES     no surgical History    Current Medications: Current Outpatient Medications on File Prior to Visit  Medication Sig  . Cholecalciferol (VITAMIN D) 50 MCG (2000 UT) tablet   . Ferrous Gluconate-C-Folic Acid (IRON-C PO)   . levocetirizine (XYZAL ALLERGY 24HR) 5 MG tablet    No current facility-administered medications on file prior to visit.     Allergies:   Dust mite extract, Other, and Pollen extract   Social History   Tobacco Use  . Smoking status: Never Smoker  . Smokeless tobacco: Never Used  Vaping Use  . Vaping Use: Never used  Substance Use Topics  . Alcohol use: Yes    Alcohol/week: 1.0 standard drink    Types: 1 Glasses of wine per week    Comment: Occasional  . Drug use: No    Family History: family history includes Cancer in her paternal grandfather; Colon cancer in her maternal grandfather; Colon polyps in her mother; Depression in her maternal grandmother; Hyperlipidemia in her father; Hypertension in her maternal grandmother.  ROS:   Please see the history of present illness.  Additional pertinent ROS: Constitutional: Negative for chills, fever, night sweats, unintentional weight loss  HENT: Negative for ear pain and hearing loss.   Eyes: Negative for loss of vision and eye pain.  Respiratory: Negative for cough, sputum,  wheezing.   Cardiovascular: See HPI. Gastrointestinal: Negative for abdominal pain, melena, and hematochezia.  Genitourinary: Negative for dysuria and hematuria.  Musculoskeletal: Negative for falls and myalgias.  Skin: Negative for itching and rash.  Neurological:  Negative for focal weakness, focal sensory changes and loss of consciousness.  Endo/Heme/Allergies: Does not bruise/bleed easily.     EKGs/Labs/Other Studies Reviewed:    The following studies were reviewed today: Echo 04/18/2015 - Left ventricle: The cavity size was normal. Wall thickness was  normal. Systolic function was normal. The estimated ejection  fraction was in the range of 60% to 65%. Wall motion was normal;  there were no regional wall motion abnormalities.   Holter monitor 04/18/2015: Sinus with rare pac and pvc   EKG:  EKG is personally reviewed.  The ekg ordered today demonstrates NSR at 84 bpm  Recent Labs: 03/01/2020: ALT 7; BUN 8; Creatinine, Ser 0.75; Hemoglobin 14.2; Platelets 182; Potassium 3.9; Sodium 139  Recent Lipid Panel No results found for: CHOL, TRIG, HDL, CHOLHDL, VLDL, LDLCALC, LDLDIRECT  Physical Exam:    VS:  BP 104/72   Pulse 84   Ht 5\' 3"  (1.6 m)   Wt 103 lb (46.7 kg)   SpO2 99%   BMI 18.25 kg/m     Wt Readings from Last 3 Encounters:  03/30/20 103 lb (46.7 kg)  03/01/20 103 lb 9.6 oz (47 kg)  04/05/15 105 lb 1.9 oz (47.7 kg)    GEN: Well nourished, well developed in no acute distress HEENT: Normal, moist mucous membranes NECK: No JVD CARDIAC: regular rhythm, normal S1 and S2, no rubs or gallops. No murmurs. VASCULAR: Radial and DP pulses 2+ bilaterally. No carotid bruits RESPIRATORY:  Clear to auscultation without rales, wheezing or rhonchi  ABDOMEN: Soft, non-tender, non-distended MUSCULOSKELETAL:  Ambulates independently SKIN: Warm and dry, no edema NEUROLOGIC:  Alert and oriented x 3. No focal neuro deficits noted. PSYCHIATRIC:  Normal affect    ASSESSMENT:    1. Palpitations   2. Cardiac risk counseling   3. Counseling on health promotion and disease prevention    PLAN:    Palpitations: -we discussed the potential causes of fast heart rates and palpitations today. Reviewed the normal electrical system of the heart.  Reviewed the role of the sinus node. Reviewed the balance between resting (vagal) tone and fight or flight nervous system input. Reviewed how exercise improves vagal tone and lowers resting heart rate. Reviewed that sinus tachycardia, or elevated sinus rate, is usually secondary to something else in the body. This can include pain, stress, infection, anxiety, hormone imbalance, low blood counts, etc. Discussed that we do not typically treat sinus tachycardia by itself, and instead the focus is on finding what is driving the heart rate and treating that. We discussed that there can be other rhythm issues, from either the top or bottom of the heart, that are abnormal rhythms. Discussed how we evaluate for these. -since symptoms have resolved with abstaining from alcohol, will hold on further evaluation at this time. However, if symptoms recur, would place monitor for further evaluation -discussed red flag warning signs that need immediate medical attention  Cardiac risk counseling and prevention recommendations: -recommend heart healthy/Mediterranean diet, with whole grains, fruits, vegetable, fish, lean meats, nuts, and olive oil. Limit salt. -recommend moderate walking, 3-5 times/week for 30-50 minutes each session. Aim for at least 150 minutes.week. Goal should be pace of 3 miles/hours, or walking 1.5 miles in 30 minutes -recommend avoidance of tobacco products. Avoid excess  alcohol. -ASCVD risk score: The ASCVD Risk score Denman George DC Jr., et al., 2013) failed to calculate for the following reasons:   Cannot find a previous HDL lab   Cannot find a previous total cholesterol lab    Plan for follow up: as needed  Jodelle Red, MD, PhD [place]  [facility]    Medication Adjustments/Labs and Tests Ordered: Current medicines are reviewed at length with the patient today.  Concerns regarding medicines are outlined above.  Orders Placed This Encounter  Procedures  . EKG 12-Lead   No  orders of the defined types were placed in this encounter.   Patient Instructions  Other instruction: If  your heartbeats start back to being fast/hard, and if it is frequent, please call us and we will send you a two week monitor. Otherwise, the option is for a [employer] device, which you keep, and you can send me the strips through mychart.  Medication Instructions:  No changes  *If you need a refill on your cardiac medications before your next appointment, please call your pharmacy*   Lab Work: Not needed   Testing/Procedures: Not needed   Follow-Up: At [facility], you and your health needs are our priority.  As part of our continuing mission to provide you with exceptional heart care, we have created designated Provider Care Teams.  These Care Teams include your primary Cardiologist (physician) and Advanced Practice Providers (APPs -  Physician Assistants and Nurse Practitioners) who all work together to provide you with the care you need, when you need it.     Your next appointment:    as needed  The format for your next appointment:   in person or virtual   Provider:   You may see Jodelle Red, MD or one of the following Advanced Practice Providers on your designated Care Team:    Theodore Demark, PA-C  Joni Reining, DNP, ANP  Cadence Fransico Michael, PA-C        Signed, Jodelle Red, MD PhD 03/30/2020   [facility]

## 2020-06-09 ENCOUNTER — Encounter: Payer: Self-pay | Admitting: Internal Medicine

## 2020-06-16 ENCOUNTER — Encounter: Payer: Self-pay | Admitting: Cardiology

## 2020-09-27 DIAGNOSIS — Z20822 Contact with and (suspected) exposure to covid-19: Secondary | ICD-10-CM | POA: Diagnosis not present

## 2020-10-23 DIAGNOSIS — Z03818 Encounter for observation for suspected exposure to other biological agents ruled out: Secondary | ICD-10-CM | POA: Diagnosis not present

## 2020-10-23 DIAGNOSIS — Z20822 Contact with and (suspected) exposure to covid-19: Secondary | ICD-10-CM | POA: Diagnosis not present
